# Patient Record
Sex: Male | Born: 1970 | Race: White | Hispanic: Yes | Marital: Single | Smoking: Former smoker
Health system: Southern US, Community
[De-identification: ages and names within clinical notes are randomized; demographics above are authoritative.]

## PROBLEM LIST (undated history)

## (undated) DIAGNOSIS — F419 Anxiety disorder, unspecified: Secondary | ICD-10-CM

## (undated) DIAGNOSIS — F32A Depression, unspecified: Secondary | ICD-10-CM

## (undated) DIAGNOSIS — F329 Major depressive disorder, single episode, unspecified: Secondary | ICD-10-CM

## (undated) DIAGNOSIS — F1910 Other psychoactive substance abuse, uncomplicated: Secondary | ICD-10-CM

## (undated) DIAGNOSIS — K4090 Unilateral inguinal hernia, without obstruction or gangrene, not specified as recurrent: Secondary | ICD-10-CM

## (undated) HISTORY — DX: Other psychoactive substance abuse, uncomplicated: F19.10

## (undated) HISTORY — DX: Depression, unspecified: F32.A

## (undated) HISTORY — DX: Major depressive disorder, single episode, unspecified: F32.9

---

## 1999-03-09 ENCOUNTER — Emergency Department (HOSPITAL_COMMUNITY): Admission: EM | Admit: 1999-03-09 | Discharge: 1999-03-09 | Payer: Self-pay | Admitting: Emergency Medicine

## 1999-03-09 ENCOUNTER — Encounter: Payer: Self-pay | Admitting: Emergency Medicine

## 2005-11-18 ENCOUNTER — Emergency Department (HOSPITAL_COMMUNITY): Admission: EM | Admit: 2005-11-18 | Discharge: 2005-11-18 | Payer: Self-pay | Admitting: Emergency Medicine

## 2008-07-19 ENCOUNTER — Emergency Department (HOSPITAL_COMMUNITY): Admission: EM | Admit: 2008-07-19 | Discharge: 2008-07-19 | Payer: Self-pay | Admitting: Emergency Medicine

## 2010-05-07 LAB — CBC
HCT: 44.9 % (ref 39.0–52.0)
Hemoglobin: 15.4 g/dL (ref 13.0–17.0)
MCHC: 34.4 g/dL (ref 30.0–36.0)
MCV: 88.1 fL (ref 78.0–100.0)
Platelets: 245 10*3/uL (ref 150–400)
RBC: 5.1 MIL/uL (ref 4.22–5.81)
RDW: 12.9 % (ref 11.5–15.5)
WBC: 8 10*3/uL (ref 4.0–10.5)

## 2010-05-07 LAB — BASIC METABOLIC PANEL
BUN: 23 mg/dL (ref 6–23)
CO2: 28 mEq/L (ref 19–32)
Calcium: 9.2 mg/dL (ref 8.4–10.5)
Chloride: 101 mEq/L (ref 96–112)
Creatinine, Ser: 1.22 mg/dL (ref 0.4–1.5)
GFR calc Af Amer: >60 mL/min (ref >60)
GFR calc non Af Amer: >60 mL/min (ref >60)
Glucose, Bld: 96 mg/dL (ref 70–99)
Potassium: 4 mEq/L (ref 3.5–5.1)
Sodium: 137 mEq/L (ref 135–145)

## 2010-05-07 LAB — DIFFERENTIAL
Basophils Absolute: 0 10*3/uL (ref 0.0–0.1)
Basophils Relative: 0 % (ref 0–1)
Eosinophils Absolute: 0.3 10*3/uL (ref 0.0–0.7)
Eosinophils Relative: 3 % (ref 0–5)
Lymphocytes Relative: 28 % (ref 12–46)
Lymphs Abs: 2.2 10*3/uL (ref 0.7–4.0)
Monocytes Absolute: 0.6 10*3/uL (ref 0.1–1.0)
Monocytes Relative: 7 % (ref 3–12)
Neutro Abs: 4.9 10*3/uL (ref 1.7–7.7)
Neutrophils Relative %: 62 % (ref 43–77)

## 2011-04-14 ENCOUNTER — Encounter (HOSPITAL_COMMUNITY): Payer: Self-pay

## 2011-04-14 ENCOUNTER — Emergency Department (HOSPITAL_COMMUNITY)
Admission: EM | Admit: 2011-04-14 | Discharge: 2011-04-14 | Disposition: A | Discharge status: Home / Self Care | Payer: Self-pay | Attending: Emergency Medicine | Admitting: Emergency Medicine

## 2011-04-14 DIAGNOSIS — M79609 Pain in unspecified limb: Secondary | ICD-10-CM | POA: Insufficient documentation

## 2011-04-14 DIAGNOSIS — R209 Unspecified disturbances of skin sensation: Secondary | ICD-10-CM | POA: Insufficient documentation

## 2011-04-14 DIAGNOSIS — R200 Anesthesia of skin: Secondary | ICD-10-CM

## 2011-04-14 MED ORDER — NAPROXEN 500 MG PO TABS: 500.0000 mg | ORAL_TABLET | Freq: Two times a day (BID) | ORAL | Status: AC

## 2011-04-14 NOTE — ED Provider Notes (Signed)
Medical screening examination/treatment/procedure(s) were performed by non-physician practitioner and as supervising physician I was immediately available for consultation/collaboration.  Gardiner Espana, MD 04/14/11 1521 

## 2011-04-14 NOTE — ED Provider Notes (Signed)
History     CSN: [id number]  Arrival date & time 04/14/11  4098   First MD Initiated Contact with Patient 04/14/11 0914      Chief Complaint  Patient presents with  . Hand Pain    (Consider location/radiation/quality/duration/timing/severity/associated sxs/prior treatment) HPI Comments: Patient is a weightlifter presents with complaint of right shoulder, upper arm, forearm, and hand numbness for the past 2 weeks. Patient states he is awakened at night due to numbness sensation. Patient denies weakness in his extremity. He does not have any pain. Denies acute injury. Patient states that the numbness began in his shoulder in its gradually spread distally. Currently the patient has worse numbness in his hand and forearm and his upper arm and shoulder are normal. No treatments prior to arrival. Nothing makes it better or worse.   The history is provided by the patient.    History reviewed. No pertinent past medical history.  History reviewed. No pertinent past surgical history.  No family history on file.  History  Substance Use Topics  . Smoking status: Never Smoker   . Smokeless tobacco: Not on file  . Alcohol Use: No      Review of Systems  Constitutional: Negative for activity change.  HENT: Negative for neck pain.   Musculoskeletal: Negative for back pain, joint swelling and arthralgias.  Skin: Negative for wound.  Neurological: Positive for numbness. Negative for weakness.    Allergies  Review of patient's allergies indicates not on file.  Home Medications   Current Outpatient Rx  Name Route Sig Dispense Refill  . ACETAMINOPHEN 500 MG PO TABS Oral Take 500 mg by mouth every 6 (six) hours as needed. For headache.    . ALPRAZOLAM 1 MG PO TABS Oral Take 0.5 mg by mouth 2 (two) times daily.    [name] VITAMIN C 1000 MG PO TABS Oral Take 2,000 mg by mouth daily.    Carma Leaven M PLUS PO TABS Oral Take 1 tablet by mouth daily.    . TRAZODONE HCL 100 MG PO TABS Oral Take 100 mg  by mouth at bedtime.      BP 126/65  Pulse 51  Temp(Src) 98.4 F (36.9 C) (Oral)  Resp 16  SpO2 100%  Physical Exam  Nursing note and vitals reviewed. Constitutional: He is oriented to person, place, and time. He appears well-developed and well-nourished.  HENT:  Head: Normocephalic and atraumatic.  Eyes: Conjunctivae are normal.  Neck: Normal range of motion. Neck supple.  Cardiovascular: Normal pulses.   Pulses:      Radial pulses are 2+ on the right side, and 2+ on the left side.  Musculoskeletal: He exhibits no edema and no tenderness.  Neurological: He is alert and oriented to person, place, and time. He has normal strength. He displays no atrophy and no tremor. A sensory deficit is present. No cranial nerve deficit. He exhibits normal muscle tone. GCS eye subscore is 4. GCS verbal subscore is 5. GCS motor subscore is 6.  Reflex Scores:      Bicep reflexes are 2+ on the right side and 2+ on the left side.      Brachioradialis reflexes are 2+ on the right side and 2+ on the left side.      Numbness reported in hand and forearm, not in a definite dermatomal pattern  Skin: Skin is warm and dry.  Psychiatric: He has a normal mood and affect.    ED Course  Procedures (including critical care time)  Labs Reviewed - No data to display No results found.   1. Numbness     9:55 AM Patient seen and examined.   Vital signs reviewed and are as follows: Filed Vitals:   04/14/11 0837  BP: 126/65  Pulse: 51  Temp: 98.4 F (36.9 C)  Resp: 16   10:01 AM D/c to home with anti-inflammatory medications and orthopedic followup. Patient urged to rest his shoulder and refrain from heavy lifting until cleared to do so by orthopedic doctor. Patient verbalizes understanding and agrees with plan.   MDM  Likely nerve impingement or irritation stemming from repetitive use with weight-lifting. Rest and anti-inflammatories indicated with orthopedic followup. Patient has normal strength in  bilateral upper extremities and hands, wrist, elbow, and shoulder.        Renne Crigler, [place] 04/14/11 1006

## 2011-04-14 NOTE — Discharge Instructions (Signed)
Please read and follow all provided instructions.  Your diagnoses today include:  1. Numbness     Tests performed today include:  Vital signs. See below for your results today.   Medications prescribed:   Naproxen - anti-inflammatory pain medication  Do not exceed 500mg  naproxen every 12 hours  You have been prescribed an anti-inflammatory medication or NSAID. Take with food. Take smallest effective dose for the shortest duration needed for your pain. Stop taking if you experience stomach pain or vomiting.   Take any prescribed medications only as directed.  Home care instructions:  Follow any educational materials contained in this packet.  Follow-up instructions: Please follow-up with your primary care provider in the next 3 days for further evaluation of your symptoms. If you do not have a primary care doctor -- see below for referral information.   Call the orthopedic physician provided for further evaluation.   Do not perform any repetitive movements with your upper extremity until your symptoms improve and you are seen by an orthopedic doctor.   Return instructions:   Please return to the Emergency Department if you experience worsening symptoms.   Please return if you have any other emergent concerns.  Additional Information:  Your vital signs today were: BP 126/65  Pulse 51  Temp(Src) 98.4 F (36.9 C) (Oral)  Resp 16  SpO2 100% If your blood pressure (BP) was elevated above 135/85 this visit, please have this repeated by your doctor within one month. -------------- No Primary Care Doctor Call [facility]  [phone] Other agencies that provide inexpensive medical care    [facility]  [phone]    [facility]  [phone]    [facility]  [phone]    [facility]  [phone]    [facility]  [phone]    [facility]  [phone] -------------- RESOURCE GUIDE:  Dental Problems  Patients with  Medicaid: [facility] [phone] [address]                                            [phone] Phone:  [phone]                                                   Phone:  [phone]  If unable to pay or uninsured, contact:  [facility] or [facility]. to become qualified for the adult dental clinic.  Chronic Pain Problems Contact [facility]  [phone] Patients need to be referred by their primary care doctor.  Insufficient Money for Medicine Contact [employer]:  call "211" or [facility] [phone].  Psychological Services [facility]  [phone] [facility]  [phone] [facility]   [phone] (emergency services [phone])  Substance Abuse Resources [facility]  [phone] [facility] [phone] [facility] [place] [phone] Floydene Flock [phone] Residential & Outpatient Substance Abuse Program  [phone]  Abuse/Neglect [facility] Child Abuse Hotline [phone] [facility] Child Abuse Hotline [phone] (After Hours)  Emergency Shelter [facility] [phone]  [phone]  Maternity Homes [facility] [phone] [employer] Services [phone]  [facility] Resources  [facility]     [employer]                          [facility] Dept. [address] [place]                       [address]      [address]  Blondell Reveal Phone:  [phone]                                   Phone:  [phone]                 Phone:  [phone]  [facility] Phone:  [phone]  [facility] Child Abuse Hotline 443-003-1920 [phone] (After Hours)

## 2011-04-14 NOTE — ED Notes (Signed)
Patient reports that he developed right hand numbness and feeling asleep x 2 weeks. The first week radiated up to his shoulder this week just hand. Good capillary refill and hand warm with strong pulses. Reports does a lot of weight lifting and typing.

## 2012-04-24 ENCOUNTER — Ambulatory Visit: Payer: Self-pay | Admitting: Sports Medicine

## 2012-04-24 DIAGNOSIS — Z0289 Encounter for other administrative examinations: Secondary | ICD-10-CM

## 2013-09-20 ENCOUNTER — Encounter (HOSPITAL_BASED_OUTPATIENT_CLINIC_OR_DEPARTMENT_OTHER): Payer: Self-pay | Admitting: Emergency Medicine

## 2013-09-20 ENCOUNTER — Emergency Department (HOSPITAL_BASED_OUTPATIENT_CLINIC_OR_DEPARTMENT_OTHER)
Admission: EM | Admit: 2013-09-20 | Discharge: 2013-09-20 | Disposition: A | Discharge status: Home / Self Care | Payer: Self-pay | Attending: Emergency Medicine | Admitting: Emergency Medicine

## 2013-09-20 ENCOUNTER — Emergency Department (HOSPITAL_BASED_OUTPATIENT_CLINIC_OR_DEPARTMENT_OTHER): Payer: Self-pay

## 2013-09-20 DIAGNOSIS — N289 Disorder of kidney and ureter, unspecified: Secondary | ICD-10-CM | POA: Insufficient documentation

## 2013-09-20 DIAGNOSIS — R740 Nonspecific elevation of levels of transaminase and lactic acid dehydrogenase [LDH]: Secondary | ICD-10-CM

## 2013-09-20 DIAGNOSIS — Z79899 Other long term (current) drug therapy: Secondary | ICD-10-CM | POA: Insufficient documentation

## 2013-09-20 DIAGNOSIS — R7401 Elevation of levels of liver transaminase levels: Secondary | ICD-10-CM | POA: Insufficient documentation

## 2013-09-20 DIAGNOSIS — R079 Chest pain, unspecified: Secondary | ICD-10-CM | POA: Insufficient documentation

## 2013-09-20 DIAGNOSIS — I4510 Unspecified right bundle-branch block: Secondary | ICD-10-CM | POA: Insufficient documentation

## 2013-09-20 DIAGNOSIS — R7402 Elevation of levels of lactic acid dehydrogenase (LDH): Secondary | ICD-10-CM | POA: Insufficient documentation

## 2013-09-20 DIAGNOSIS — L509 Urticaria, unspecified: Secondary | ICD-10-CM | POA: Insufficient documentation

## 2013-09-20 LAB — COMPREHENSIVE METABOLIC PANEL
ALBUMIN: 3.4 g/dL — AB (ref 3.5–5.2)
ALT: 58 U/L — ABNORMAL HIGH (ref 0–53)
AST: 73 U/L — AB (ref 0–37)
Alkaline Phosphatase: 50 U/L (ref 39–117)
Anion gap: 13 (ref 5–15)
BILIRUBIN TOTAL: 0.5 mg/dL (ref 0.3–1.2)
BUN: 35 mg/dL — AB (ref 6–23)
CALCIUM: 9.5 mg/dL (ref 8.4–10.5)
CHLORIDE: 102 meq/L (ref 96–112)
CO2: 23 mEq/L (ref 19–32)
CREATININE: 1.4 mg/dL — AB (ref 0.50–1.35)
GFR calc Af Amer: 69 mL/min — ABNORMAL LOW (ref >90)
GFR calc non Af Amer: 59 mL/min — ABNORMAL LOW (ref >90)
Glucose, Bld: 101 mg/dL — ABNORMAL HIGH (ref 70–99)
Potassium: 3.9 mEq/L (ref 3.7–5.3)
Sodium: 138 mEq/L (ref 137–147)
Total Protein: 7.4 g/dL (ref 6.0–8.3)

## 2013-09-20 LAB — CBC WITH DIFFERENTIAL/PLATELET
BASOS ABS: 0 10*3/uL (ref 0.0–0.1)
BASOS PCT: 0 % (ref 0–1)
EOS ABS: 0.2 10*3/uL (ref 0.0–0.7)
EOS PCT: 1 % (ref 0–5)
HEMATOCRIT: 47.4 % (ref 39.0–52.0)
HEMOGLOBIN: 16.2 g/dL (ref 13.0–17.0)
Lymphocytes Relative: 22 % (ref 12–46)
Lymphs Abs: 2.6 10*3/uL (ref 0.7–4.0)
MCH: 28.9 pg (ref 26.0–34.0)
MCHC: 34.2 g/dL (ref 30.0–36.0)
MCV: 84.5 fL (ref 78.0–100.0)
MONO ABS: 0.8 10*3/uL (ref 0.1–1.0)
MONOS PCT: 7 % (ref 3–12)
Neutro Abs: 8.3 10*3/uL — ABNORMAL HIGH (ref 1.7–7.7)
Neutrophils Relative %: 70 % (ref 43–77)
Platelets: 295 10*3/uL (ref 150–400)
RBC: 5.61 MIL/uL (ref 4.22–5.81)
RDW: 14 % (ref 11.5–15.5)
WBC: 11.9 10*3/uL — ABNORMAL HIGH (ref 4.0–10.5)

## 2013-09-20 LAB — TROPONIN I: Troponin I: <0.3 ng/mL (ref <0.30)

## 2013-09-20 MED ORDER — DIPHENHYDRAMINE HCL 25 MG PO CAPS
25.0000 mg | ORAL_CAPSULE | Freq: Once | ORAL | Status: AC
  Administered 2013-09-20: 25 mg via ORAL
  Filled 2013-09-20: qty 1

## 2013-09-20 NOTE — Discharge Instructions (Signed)
Take Benadryl 25 mg every 4 hours until rash has resolved. Drink plenty of fluids. Have your kidney function and your liver function recheck by your doctor within the next week.

## 2013-09-20 NOTE — ED Notes (Signed)
Pt  Reports no pain at this time

## 2013-09-20 NOTE — ED Notes (Addendum)
Generalized rash that developed PTA. He also reports chest "compression" that developed PTA and mild SHOB.  Pt reports IM steroids recently (Saturday last dose) and daily PO steroid.

## 2013-09-20 NOTE — ED Provider Notes (Signed)
CSN: [id number]     Arrival date & time 09/20/13  1833 History  This chart was scribed for Hilario Quarry, MD by Charline Bills, ED Scribe. The patient was seen in room MH09/MH09. Patient's care was started at 7:21 PM.   Chief Complaint  Patient presents with  . Rash   Patient is a 43 y.o. male presenting with rash. The history is provided by the patient. No language interpreter was used.  Rash Location:  Leg and torso Torso rash location:  Lower back Leg rash location:  L lower leg and R lower leg Quality: itchiness and redness   Severity:  Moderate Onset quality:  Sudden Chronicity:  New Relieved by:  None tried Associated symptoms: shortness of breath (mild)    HPI Comments: Russell Calhoun is a 43 y.o. male who presents to the Emergency Department complaining of generalized rash that developed PTA. Pt first noted the rash after he woke up from a nap. He reports rash to his back and calves. He describes the rash as itchy. Pt also reports associated chest tightness and mild SOB. Pt denies chest pain. He denies new lotions. Pt states that he is taking IM and PO steroids. Pt is a nonsmoker. Pt has not taken any medications for rash.   Pt is a Systems analyst.   History reviewed. No pertinent past medical history. History reviewed. No pertinent past surgical history. No family history on file. History  Substance Use Topics  . Smoking status: Never Smoker   . Smokeless tobacco: Not on file  . Alcohol Use: No    Review of Systems  Respiratory: Positive for chest tightness and shortness of breath (mild).   Cardiovascular: Negative for chest pain.  Skin: Positive for rash.  All other systems reviewed and are negative.  Allergies  Review of patient's allergies indicates not on file.  Home Medications   Prior to Admission medications   Medication Sig Start Date End Date Taking? Authorizing Provider  methylPREDNISolone sodium succinate 30 mg/kg in sodium chloride 0.9 % 50 mL  Inject 30 mg/kg into the vein once.   Yes Historical Provider, MD  acetaminophen (TYLENOL) 500 MG tablet Take 500 mg by mouth every 6 (six) hours as needed. For headache.    Historical Provider, MD  ALPRAZolam Prudy Feeler) 1 MG tablet Take 0.5 mg by mouth daily.     Historical Provider, MD  Ascorbic Acid (VITAMIN C) 1000 MG tablet Take 2,000 mg by mouth daily.    Historical Provider, MD  Multiple Vitamins-Minerals (MULTIVITAMINS THER. W/MINERALS) TABS Take 1 tablet by mouth daily.    Historical Provider, MD  traZODone (DESYREL) 100 MG tablet Take 100 mg by mouth at bedtime.    Historical Provider, MD   Triage Vitals: BP 139/88  Pulse 76  Temp(Src) 98.6 F (37 C) (Oral)  Resp 18  Ht  (1.626 m)  Wt 175 lb (79.379 kg)  BMI 30.02 kg/m2  SpO2 100% Physical Exam  Nursing note and vitals reviewed. Constitutional: He is oriented to person, place, and time. He appears well-developed and well-nourished. No distress.  HENT:  Head: Normocephalic and atraumatic.  Right Ear: External ear normal.  Left Ear: External ear normal.  Nose: Nose normal.  Mouth/Throat: Oropharynx is clear and moist.  Eyes: Conjunctivae and EOM are normal. Pupils are equal, round, and reactive to light.  Neck: Normal range of motion. Neck supple. No tracheal deviation present.  Cardiovascular: Normal rate, regular rhythm and normal heart sounds.   Pulmonary/Chest:  Effort normal and breath sounds normal. No respiratory distress.  Abdominal: Soft. Bowel sounds are normal.  Musculoskeletal: Normal range of motion.  Neurological: He is alert and oriented to person, place, and time.  Skin: Skin is warm and dry. Rash noted. Rash is urticarial.  Psychiatric: He has a normal mood and affect. His behavior is normal. Judgment and thought content normal.   ED Course  Procedures (including critical care time) DIAGNOSTIC STUDIES: Oxygen Saturation is 100% on RA, normal by my interpretation.    COORDINATION OF CARE: 7:25  PM-Discussed treatment plan which includes CXR with pt at bedside and pt agreed to plan.   Labs Review Labs Reviewed  CBC WITH DIFFERENTIAL - Abnormal; Notable for the following:    WBC 11.9 (*)    Neutro Abs 8.3 (*)    All other components within normal limits  COMPREHENSIVE METABOLIC PANEL - Abnormal; Notable for the following:    Glucose, Bld 101 (*)    BUN 35 (*)    Creatinine, Ser 1.40 (*)    Albumin 3.4 (*)    AST 73 (*)    ALT 58 (*)    GFR calc non Af Amer 59 (*)    GFR calc Af Amer 69 (*)    All other components within normal limits  TROPONIN I   Imaging Review Dg Chest 2 View  09/20/2013   CLINICAL DATA:  Woke up with a rash with chest tightness  EXAM: CHEST  2 VIEW  COMPARISON:  None.  FINDINGS: The heart size and mediastinal contours are within normal limits. Both lungs are clear. The visualized skeletal structures are unremarkable.  IMPRESSION: No active cardiopulmonary disease.   Electronically Signed   By: Esperanza Heir M.D.   On: 09/20/2013 20:25    EKG Interpretation   Date/Time:  Monday September 20 2013 19:08:53 EDT Ventricular Rate:  69 PR Interval:  162 QRS Duration: 148 QT Interval:  428 QTC Calculation: 458 R Axis:   42 Text Interpretation:  Normal sinus rhythm Right bundle branch block  Abnormal ECG T wave inversion in v1-v3 No old tracing to compare Confirmed  by Zariya Minner MD, Duwayne Heck [phone]) on 09/20/2013 7:10:04 PM      MDM   Final diagnoses:  Urticarial rash  Right bundle branch block  Elevated transaminase level  Renal insufficiency    43 year old male comes today with a rash consistent with urticaria. He does not have any other signs or symptoms of acute allergic reaction. However he voices concerns because he has been using steroids. His creatinine is 1.4 and transaminases are elevated which could be consistent with steroid use. Given Benadryl here. An EKG was done by nursing because he had some tightness in his chest. This shows a right bundle  branch block. I do not many old tracings to compare. His not having any pain consistent with cardiac pain. I have advised the patient to stop using any parenteral or oral anabolic steroids. He is advised to fluids and have his creatinine rechecked. He is to take Benadryl every 4-6 hours until rash has resolved.  I personally performed the services described in this documentation, which was scribed in my presence. The recorded information has been reviewed and is accurate.     Hilario Quarry, MD 09/20/13 [phone]

## 2014-05-19 ENCOUNTER — Emergency Department (HOSPITAL_BASED_OUTPATIENT_CLINIC_OR_DEPARTMENT_OTHER): Payer: Self-pay

## 2014-05-19 ENCOUNTER — Encounter (HOSPITAL_BASED_OUTPATIENT_CLINIC_OR_DEPARTMENT_OTHER): Payer: Self-pay | Admitting: *Deleted

## 2014-05-19 ENCOUNTER — Emergency Department (HOSPITAL_BASED_OUTPATIENT_CLINIC_OR_DEPARTMENT_OTHER)
Admission: EM | Admit: 2014-05-19 | Discharge: 2014-05-20 | Disposition: A | Discharge status: Home / Self Care | Payer: Self-pay | Attending: Emergency Medicine | Admitting: Emergency Medicine

## 2014-05-19 DIAGNOSIS — F419 Anxiety disorder, unspecified: Secondary | ICD-10-CM | POA: Insufficient documentation

## 2014-05-19 DIAGNOSIS — K529 Noninfective gastroenteritis and colitis, unspecified: Secondary | ICD-10-CM

## 2014-05-19 DIAGNOSIS — Z79899 Other long term (current) drug therapy: Secondary | ICD-10-CM | POA: Insufficient documentation

## 2014-05-19 DIAGNOSIS — R1011 Right upper quadrant pain: Secondary | ICD-10-CM

## 2014-05-19 HISTORY — DX: Anxiety disorder, unspecified: F41.9

## 2014-05-19 LAB — LIPASE, BLOOD: Lipase: 46 U/L (ref 11–59)

## 2014-05-19 LAB — COMPREHENSIVE METABOLIC PANEL
ALBUMIN: 3.6 g/dL (ref 3.5–5.2)
ALT: 48 U/L (ref 0–53)
ANION GAP: 7 (ref 5–15)
AST: 59 U/L — ABNORMAL HIGH (ref 0–37)
Alkaline Phosphatase: 53 U/L (ref 39–117)
BILIRUBIN TOTAL: 1.1 mg/dL (ref 0.3–1.2)
BUN: 18 mg/dL (ref 6–23)
CHLORIDE: 104 mmol/L (ref 96–112)
CO2: 25 mmol/L (ref 19–32)
Calcium: 8 mg/dL — ABNORMAL LOW (ref 8.4–10.5)
Creatinine, Ser: 1.21 mg/dL (ref 0.50–1.35)
GFR, EST AFRICAN AMERICAN: 81 mL/min — AB (ref >90)
GFR, EST NON AFRICAN AMERICAN: 70 mL/min — AB (ref >90)
Glucose, Bld: 102 mg/dL — ABNORMAL HIGH (ref 70–99)
POTASSIUM: 4.5 mmol/L (ref 3.5–5.1)
Sodium: 136 mmol/L (ref 135–145)
Total Protein: 6.8 g/dL (ref 6.0–8.3)

## 2014-05-19 LAB — URINALYSIS, ROUTINE W REFLEX MICROSCOPIC
Bilirubin Urine: NEGATIVE
GLUCOSE, UA: NEGATIVE mg/dL
KETONES UR: 15 mg/dL — AB
LEUKOCYTES UA: NEGATIVE
NITRITE: NEGATIVE
PROTEIN: 30 mg/dL — AB
Specific Gravity, Urine: 1.027 (ref 1.005–1.030)
UROBILINOGEN UA: 0.2 mg/dL (ref 0.0–1.0)
pH: 5.5 (ref 5.0–8.0)

## 2014-05-19 LAB — CBC WITH DIFFERENTIAL/PLATELET
BASOS ABS: 0 10*3/uL (ref 0.0–0.1)
Basophils Relative: 0 % (ref 0–1)
EOS PCT: 2 % (ref 0–5)
Eosinophils Absolute: 0.1 10*3/uL (ref 0.0–0.7)
HCT: 45.6 % (ref 39.0–52.0)
HEMOGLOBIN: 15.2 g/dL (ref 13.0–17.0)
LYMPHS ABS: 1.6 10*3/uL (ref 0.7–4.0)
LYMPHS PCT: 35 % (ref 12–46)
MCH: 28.3 pg (ref 26.0–34.0)
MCHC: 33.3 g/dL (ref 30.0–36.0)
MCV: 84.8 fL (ref 78.0–100.0)
Monocytes Absolute: 0.5 10*3/uL (ref 0.1–1.0)
Monocytes Relative: 10 % (ref 3–12)
NEUTROS ABS: 2.4 10*3/uL (ref 1.7–7.7)
NEUTROS PCT: 53 % (ref 43–77)
PLATELETS: 225 10*3/uL (ref 150–400)
RBC: 5.38 MIL/uL (ref 4.22–5.81)
RDW: 15.3 % (ref 11.5–15.5)
WBC: 4.6 10*3/uL (ref 4.0–10.5)

## 2014-05-19 LAB — URINE MICROSCOPIC-ADD ON

## 2014-05-19 LAB — I-STAT CG4 LACTIC ACID, ED: Lactic Acid, Venous: 1.49 mmol/L (ref 0.5–2.0)

## 2014-05-19 MED ORDER — IOHEXOL 300 MG/ML  SOLN
25.0000 mL | Freq: Once | INTRAMUSCULAR | Status: AC | PRN
  Administered 2014-05-19: 25 mL via ORAL

## 2014-05-19 MED ORDER — SODIUM CHLORIDE 0.9 % IV BOLUS (SEPSIS)
1000.0000 mL | Freq: Once | INTRAVENOUS | Status: AC
  Administered 2014-05-19: 1000 mL via INTRAVENOUS

## 2014-05-19 MED ORDER — IOHEXOL 300 MG/ML  SOLN
100.0000 mL | Freq: Once | INTRAMUSCULAR | Status: AC | PRN
  Administered 2014-05-19: 100 mL via INTRAVENOUS

## 2014-05-19 NOTE — ED Provider Notes (Signed)
CSN: [id number]     Arrival date & time 05/19/14  1950 History  This [id number] was scribed for Elwin MochaBlair Finnegan Gatta, MD by Gwenyth Oberatherine Macek, ED Scribe. This patient was seen in room MH10/MH10 and the patient's care was started at 10:03 PM.   Chief Complaint  Patient presents with  . Diarrhea   Patient is a 44 y.o. male presenting with diarrhea. The history is provided by the patient. No language interpreter was used.  Diarrhea Quality:  Blood-tinged and watery Severity:  Moderate Onset quality:  Gradual Duration:  4 days Timing:  Constant Progression:  Unchanged Relieved by:  Nothing Worsened by:  Nothing tried Ineffective treatments:  None tried Associated symptoms: abdominal pain, fever and vomiting   Risk factors: sick contacts   Risk factors: no recent antibiotic use and no travel to endemic areas     HPI Comments: Russell Calhoun is a 44 y.o. male who presents to the Emergency Department complaining of intermittent, moderate, blood-tinged diarrhea that started 4 days ago. He states bloating, abdominal pain, fever of 102 and 1 episode of vomiting that occurred 3 days ago as associated symptoms. His last documented fever was 4 days ago, but pt notes that he felt feverish yesterday. Pt reports withdrawal after stopping Xanax-1 mg TID. 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 His last dose of steroids was 10 days ago and was followed by a fever. Pt denies recent international travel and antibiotic treatment. His daughter was sick with vomiting, diarrhea and fever last week.  He denies erythema or swelling of his steroid injection sites as associated symptoms.    Past Medical History  Diagnosis Date  . Anxiety    History reviewed. No pertinent past surgical history. No family history on file. History  Substance Use Topics  . Smoking status: Never Smoker   . Smokeless tobacco: Not on file  . Alcohol Use: No    Review of Systems   Constitutional: Positive for fever.  Gastrointestinal: Positive for vomiting, abdominal pain, diarrhea and abdominal distention.  Skin: Negative for wound.  All other systems reviewed and are negative.   Allergies  Review of patient's allergies indicates no known allergies.  Home Medications   Prior to Admission medications   Medication Sig Start Date End Date Taking? Authorizing Provider  acetaminophen (TYLENOL) 500 MG tablet Take 500 mg by mouth every 6 (six) hours as needed. For headache.    Historical Provider, MD  ALPRAZolam Prudy Feeler(XANAX) 1 MG tablet Take 0.5 mg by mouth daily.     Historical Provider, MD  Ascorbic Acid (VITAMIN C) 1000 MG tablet Take 2,000 mg by mouth daily.    Historical Provider, MD  methylPREDNISolone sodium succinate 30 mg/kg in sodium chloride 0.9 % 50 mL Inject 30 mg/kg into the vein once.    Historical Provider, MD  Multiple Vitamins-Minerals (MULTIVITAMINS THER. W/MINERALS) TABS Take 1 tablet by mouth daily.    Historical Provider, MD  traZODone (DESYREL) 100 MG tablet Take 100 mg by mouth at bedtime.    Historical Provider, MD   BP 124/79 mmHg  Pulse 66  Temp(Src) 98.6 F (37 C) (Oral)  Resp 18  Ht 5\' 3"  (1.6 m)  Wt 162 lb (73.483 kg)  BMI 28.70 kg/m2  SpO2 100% Physical Exam  Constitutional: He is oriented to person, place, and time. He appears well-developed and well-nourished. No distress.  HENT:  Head: Normocephalic and atraumatic.  Mouth/Throat: Oropharynx is  clear and moist. No oropharyngeal exudate.  Eyes: EOM are normal. Pupils are equal, round, and reactive to light.  Neck: Normal range of motion. Neck supple.  Cardiovascular: Normal rate and regular rhythm.  Exam reveals no friction rub.   No murmur heard. Pulmonary/Chest: Effort normal and breath sounds normal. No respiratory distress. He has no wheezes. He has no rales.  Abdominal: Soft. He exhibits no distension. There is tenderness (RLQ, RUQ). There is no rebound.  Musculoskeletal:  Normal range of motion. He exhibits no edema.  Neurological: He is alert and oriented to person, place, and time. No cranial nerve deficit. He exhibits normal muscle tone. Coordination normal.  Skin: No rash noted. He is not diaphoretic.  Nursing note and vitals reviewed.   ED Course  Procedures   DIAGNOSTIC STUDIES: Oxygen Saturation is 100% on RA, normal by my interpretation.    COORDINATION OF CARE: 10:17 PM Discussed treatment plan with pt which includes CT abdomen. Pt agreed to plan.  Labs Review Labs Reviewed  URINALYSIS, ROUTINE W REFLEX MICROSCOPIC - Abnormal; Notable for the following:    Hgb urine dipstick MODERATE (*)    Ketones, ur 15 (*)    Protein, ur 30 (*)    All other components within normal limits  URINE MICROSCOPIC-ADD ON  CBC WITH DIFFERENTIAL/PLATELET  COMPREHENSIVE METABOLIC PANEL    Imaging Review Ct Abdomen Pelvis W Contrast  05/19/2014   CLINICAL DATA:  Right upper and right lower quadrant pain for 4 days. Fever, nausea, vomiting, and diarrhea.  EXAM: CT ABDOMEN AND PELVIS WITH CONTRAST  TECHNIQUE: Multidetector CT imaging of the abdomen and pelvis was performed using the standard protocol following bolus administration of intravenous contrast.  CONTRAST:  25mL OMNIPAQUE IOHEXOL 300 MG/ML SOLN, OMNIPAQUE IOHEXOL 300 MG/ML SOLN  COMPARISON:  None.  FINDINGS: Minimal atelectasis is noted in the lung bases.  The liver, gallbladder, spleen, adrenal glands, kidneys, and pancreas are unremarkable.  Evaluation is mildly limited by paucity of intra-abdominal fat. Oral contrast is present loops of proximal small bowel. Mid and distal small bowel are diffusely fluid-filled but not frankly dilated, measuring up to approximately 2.5 cm in diameter. The appendix is not definitively identified although it may be just inferior to the cecum in the right lower quadrant (series 2, image 54 and series 5, image 38). No frank inflammatory changes are identified in this region.  No free fluid or enlarged lymph nodes are identified. No acute osseous abnormality is identified.  IMPRESSION: 1. Mildly prominent, diffusely fluid-filled small bowel loops, query enteritis. 2. Appendix not well visualized, however no definite inflammatory change in the right lower quadrant is identified.   Electronically Signed   By: Sebastian Ache   On: 05/19/2014 23:30     EKG Interpretation None      MDM   Final diagnoses:  RUQ pain  Enteritis     44 year old male here with diarrhea. He also does report some fever. Had some blood in his diarrhea yesterday but none today. Last documented fever was 3-4 days ago at 102F. Patient had a sick contact, daughter had vomiting and diarrhea recently. He thinks he started that with her. Patient also has stopped taking Xanax and IM anabolic steroids. He thinks he might of gotten a bad case of steroids is getting fevers every time he is injecting. Here vitals are stable. All injection sites in the bilateral deltoids look normal. No signs of abscess or cellulitis. Belly with mild right lower quadrant right upper quadrant  tenderness. Labs are all normal. CT shows enteritis but no signs of appendicitis. With recent blood in his stool, do not feel he needs antibiotics at this time which might exacerbate his symptoms. Started to follow PCP 3-4 days if symptoms do not improve. Stable for discharge.  I personally performed the services described in this documentation, which was scribed in my presence. The recorded information has been reviewed and is accurate.     Elwin Mocha, MD 05/20/14 [phone]

## 2014-05-19 NOTE — Discharge Instructions (Signed)

## 2014-05-19 NOTE — ED Notes (Addendum)
Fever since Saturday. Diarrhea since Sunday. Vomiting on Monday. States he has not been able to get a Rx for Xanax from his MD for a week. He has been buying street Xanax. Last dose was Saturday. States he feels he is withdrawing.

## 2014-05-19 NOTE — ED Notes (Signed)
Presents to ED w/ c/o fever since Saturday, Sunday began having diarrhea, has had one episode of vomiting with some nausea on this past Monday, also states has not been able to see primary MD for 2 months, states had to stop taking Xanax 1mg , TID, suddenly. Also states has been taking steroids, suddenly stopped this as well.

## 2014-05-19 NOTE — ED Notes (Signed)
Pt placed on cardiac monitor, VS obtained/ repeated after nsg assessment

## 2014-05-23 ENCOUNTER — Encounter: Payer: Self-pay | Admitting: Medical

## 2014-05-23 ENCOUNTER — Ambulatory Visit (INDEPENDENT_AMBULATORY_CARE_PROVIDER_SITE_OTHER): Payer: Self-pay | Admitting: Medical

## 2014-05-23 VITALS — BP 130/80 | HR 56 | Temp 97.9°F | Ht 63.5 in | Wt 169.8 lb

## 2014-05-23 DIAGNOSIS — N529 Male erectile dysfunction, unspecified: Secondary | ICD-10-CM | POA: Insufficient documentation

## 2014-05-23 DIAGNOSIS — G4700 Insomnia, unspecified: Secondary | ICD-10-CM

## 2014-05-23 DIAGNOSIS — F411 Generalized anxiety disorder: Secondary | ICD-10-CM | POA: Insufficient documentation

## 2014-05-23 DIAGNOSIS — F329 Major depressive disorder, single episode, unspecified: Secondary | ICD-10-CM

## 2014-05-23 DIAGNOSIS — F32A Depression, unspecified: Secondary | ICD-10-CM | POA: Insufficient documentation

## 2014-05-23 DIAGNOSIS — N528 Other male erectile dysfunction: Secondary | ICD-10-CM

## 2014-05-23 DIAGNOSIS — K529 Noninfective gastroenteritis and colitis, unspecified: Secondary | ICD-10-CM

## 2014-05-23 MED ORDER — SILDENAFIL CITRATE 100 MG PO TABS: 100.0000 mg | ORAL_TABLET | Freq: Every day | ORAL | Status: DC | PRN

## 2014-05-23 MED ORDER — VENLAFAXINE HCL ER 75 MG PO CP24: 75.0000 mg | ORAL_CAPSULE | Freq: Every day | ORAL | Status: DC

## 2014-05-23 MED ORDER — TRAZODONE HCL 100 MG PO TABS: 100.0000 mg | ORAL_TABLET | Freq: Every evening | ORAL | Status: DC | PRN

## 2014-05-23 MED ORDER — BUSPIRONE HCL 7.5 MG PO TABS: ORAL_TABLET | ORAL | Status: DC

## 2014-05-23 NOTE — Assessment & Plan Note (Signed)
Will rx effexor. This may also help with your anxiety. Start this in about 4-5 days after buspirone started.

## 2014-05-23 NOTE — Assessment & Plan Note (Signed)
Pt has been on benzo for years but recently got off cold [place]. Anxiety is coming back. I will rx buspirone. Since chronic condition. This likley better than being on high dose benzo.  Do not mix buspirone with any benzo.  Follow up in one month or as needed.

## 2014-05-23 NOTE — Progress Notes (Signed)
Pre visit review using our clinic review tool, if applicable. No additional management support is needed unless otherwise documented below in the visit note. 

## 2014-05-23 NOTE — Assessment & Plan Note (Signed)
Hx of and uses trazadone in past. Uses 100 mg tabs.

## 2014-05-23 NOTE — Assessment & Plan Note (Signed)
Rx viagra. 

## 2014-05-23 NOTE — Progress Notes (Signed)
Subjective:    Patient ID: Russell Calhoun, male    DOB: 04-Oct-1970, 44 y.o.   MRN: [id number]  HPI   I have reviewed pt PMH, PSH, FH, Social History and Surgical History.  Anxiety- pt has some anxiety throughout his life. He states with work and responsibility. He was trazadone and some anxiety in th past. Pt has been on xanax for a long time. He cut back completely. He was formerly on xanax 1 mg tid for years. He is on clonazapam which he borrowed from his girlfriend but on for 3 days and then ran out.  Pt has history of depression. Pt mom commited suicide(Pt witnessed the suicide). Pt mom was bipolar. Pt never been on effexor. Pt has tried paxil, sertraline, and celexa. He not sure if those worked. He feels like this helped most.   Pt also has some erectile dysfunction. He related this to steroid use. 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 Pt not used steroids now for 8 weeks.  Pt has some insomnia. He can't sleep unless he uses trazadone.  Substance abuse- in the past used Street drugs. But none since making come back.   Lt shoulder surgery. Also left knee surgery.(Both were related to spurrs)  Pt in ED recently. This past Sunday had fever, chills and diarrhea. He had some labs and IV. Determined viral gastroenteritis.   By Saturday am he was better. His diarrhea has improved.     Review of Systems  Constitutional: Negative for fever, chills, diaphoresis, activity change and fatigue.  Respiratory: Negative for cough, chest tightness and shortness of breath.   Cardiovascular: Negative for chest pain, palpitations and leg swelling.  Gastrointestinal: Negative for nausea, vomiting and abdominal pain.       Resolved.  Genitourinary:       Erectile dysfunction.  Musculoskeletal: Negative for neck pain and neck stiffness.  Neurological: Negative for dizziness, tremors, seizures, syncope, facial asymmetry, speech difficulty, weakness, light-headedness, numbness and  headaches.  Psychiatric/Behavioral: Positive for dysphoric mood. Negative for behavioral problems, confusion and agitation. The patient is nervous/anxious.        But controlled presently.  Insmonia with his depression and anxiety.    Past Medical History  Diagnosis Date  . Anxiety   . Depression   . Substance abuse     History   Social History  . Marital Status: Single    Spouse Name: N/A  . Number of Children: N/A  . Years of Education: N/A   Occupational History  . Not on file.   Social History Main Topics  . Smoking status: Never Smoker   . Smokeless tobacco: Not on file  . Alcohol Use: No  . Drug Use: Not on file  . Sexual Activity: Not on file   Other Topics Concern  . Not on file   Social History Narrative    No past surgical history on file.  Family History  Problem Relation Age of Onset  . Mental illness Mother   . HIV/AIDS Father     No Known Allergies  Current Outpatient Prescriptions on File Prior to Visit  Medication Sig Dispense Refill  . acetaminophen (TYLENOL) 500 MG tablet Take 500 mg by mouth every 6 (six) hours as needed. For headache.    . Ascorbic Acid (VITAMIN C) 1000 MG tablet Take 2,000 mg by mouth daily.    . Multiple Vitamins-Minerals (MULTIVITAMINS THER. W/MINERALS) TABS Take 1 tablet by mouth daily.     No current facility-administered  medications on file prior to visit.    BP 130/80 mmHg  Pulse 56  Temp(Src) 97.9 F (36.6 C) (Oral)  Ht 5' 3.5" (1.613 m)  Wt 169 lb 12.8 oz (77.021 kg)  BMI 29.60 kg/m2  SpO2 96%       Objective:   Physical Exam  General Mental Status- Alert. General Appearance- Not in acute distress.   Eyes- Scleraeral/Conjuntiva-bilat- Not Yellow  Skin General: Color- Normal Color. Moisture- Normal Moisture.  Neck Carotid Arteries- Normal color. Moisture- Normal Moisture. No carotid bruits. No JVD.  Chest and Lung Exam Auscultation: Breath  Sounds:-Normal.  Cardiovascular Auscultation:Rythm- Regular. Murmurs & Other Heart Sounds:Auscultation of the heart reveals- No Murmurs.    Neurologic Cranial Nerve exam:- CN III-XII intact(No nystagmus), symmetric smile. Strength:- 5/5 equal and symmetric strength both upper and lower extremities.    Eyes- Scleraeral/Conjuntiva-bilat- Not Yellow.   Abdomen Inspection:-Inspection Normal.  Palpation/Perucssion: Palpation and Percussion of the abdomen reveal- Non Tender, No Rebound tenderness, No rigidity(Guarding) and No Palpable abdominal masses.  Liver:-Normal.  Spleen:- Normal.        Assessment & Plan:

## 2014-05-23 NOTE — Patient Instructions (Addendum)
Generalized anxiety disorder Pt has been on benzo for years but recently got off cold [place]. Anxiety is coming back. I will rx buspirone. Since chronic condition. This likley better than being on high dose benzo.  Do not mix buspirone with any benzo.  Follow up in one month or as needed.   Depression Will rx effexor. This may also help with your anxiety. Start this in about 4-5 days after buspirone started.   Insomnia Hx of and uses trazadone in past. Uses 100 mg tabs.   Gastroenteritis Resolved now.   Erectile dysfunction Rx viagra.     Follow up in one month or as needed  Pt is aware of dangers of anabolic steroids. When in for follow up will counsel him further on dangers.

## 2014-05-23 NOTE — Assessment & Plan Note (Signed)
Resolved now. 

## 2014-05-27 ENCOUNTER — Telehealth: Payer: Self-pay | Admitting: Medical

## 2014-05-27 NOTE — Telephone Encounter (Signed)
Caller name:Rhude, Kraven Relation to ZO:XWRUpt:self Call back number:[phone] Pharmacy:[facility]  Reason for call:  Pt was seen on 05/23/14 states Ramon Dredgedward had informed him to let him know if the buspirone was not working, pt states that the rx is not working for him at all and he would like to try clonazepan if possible. States he has tried it and it works for him.

## 2014-05-30 ENCOUNTER — Telehealth: Payer: Self-pay | Admitting: Medical

## 2014-05-30 NOTE — Telephone Encounter (Signed)
Left a message for pt to call back. He was not available. Was going to discuss rx of clonazapem.

## 2014-05-30 NOTE — Telephone Encounter (Signed)
Ask pt to call back and speak with lpn. Then I can speak with him directly.

## 2014-06-02 ENCOUNTER — Telehealth: Payer: Self-pay | Admitting: Medical

## 2014-06-02 NOTE — Telephone Encounter (Signed)
I talked with pt regarding policy of controlled meds and eventual cost of drug screen which is quite high(he has no  Insurance). He has appointment with other provider today. He understands why did not rx benzo. Unforunately buspirone did not help.

## 2014-06-29 ENCOUNTER — Ambulatory Visit: Payer: Self-pay | Admitting: Medical

## 2014-06-29 DIAGNOSIS — Z0289 Encounter for other administrative examinations: Secondary | ICD-10-CM

## 2014-07-05 ENCOUNTER — Encounter: Payer: Self-pay | Admitting: Medical

## 2014-07-05 ENCOUNTER — Telehealth: Payer: Self-pay | Admitting: Medical

## 2014-07-05 NOTE — Telephone Encounter (Signed)
Pt was no show for appt on 06/29/14- letter sent. Charge pt?

## 2014-07-05 NOTE — Telephone Encounter (Signed)
No charge. 

## 2015-04-25 ENCOUNTER — Ambulatory Visit: Payer: Self-pay | Admitting: Family Medicine

## 2016-02-19 ENCOUNTER — Encounter (HOSPITAL_BASED_OUTPATIENT_CLINIC_OR_DEPARTMENT_OTHER): Payer: Self-pay | Admitting: *Deleted

## 2016-02-19 ENCOUNTER — Emergency Department (HOSPITAL_BASED_OUTPATIENT_CLINIC_OR_DEPARTMENT_OTHER): Payer: Self-pay

## 2016-02-19 ENCOUNTER — Emergency Department (HOSPITAL_BASED_OUTPATIENT_CLINIC_OR_DEPARTMENT_OTHER)
Admission: EM | Admit: 2016-02-19 | Discharge: 2016-02-20 | Disposition: A | Discharge status: Other Acute Inpatient Hospital | Payer: Self-pay | Attending: Emergency Medicine | Admitting: Emergency Medicine

## 2016-02-19 DIAGNOSIS — R45851 Suicidal ideations: Secondary | ICD-10-CM | POA: Insufficient documentation

## 2016-02-19 DIAGNOSIS — T50902A Poisoning by unspecified drugs, medicaments and biological substances, intentional self-harm, initial encounter: Secondary | ICD-10-CM

## 2016-02-19 DIAGNOSIS — Y9389 Activity, other specified: Secondary | ICD-10-CM | POA: Insufficient documentation

## 2016-02-19 DIAGNOSIS — S0990XA Unspecified injury of head, initial encounter: Secondary | ICD-10-CM | POA: Insufficient documentation

## 2016-02-19 DIAGNOSIS — Y998 Other external cause status: Secondary | ICD-10-CM | POA: Insufficient documentation

## 2016-02-19 DIAGNOSIS — T424X2A Poisoning by benzodiazepines, intentional self-harm, initial encounter: Secondary | ICD-10-CM | POA: Insufficient documentation

## 2016-02-19 DIAGNOSIS — Z79899 Other long term (current) drug therapy: Secondary | ICD-10-CM | POA: Insufficient documentation

## 2016-02-19 DIAGNOSIS — T1491XA Suicide attempt, initial encounter: Secondary | ICD-10-CM

## 2016-02-19 DIAGNOSIS — Y9259 Other trade areas as the place of occurrence of the external cause: Secondary | ICD-10-CM | POA: Insufficient documentation

## 2016-02-19 DIAGNOSIS — W1839XA Other fall on same level, initial encounter: Secondary | ICD-10-CM | POA: Insufficient documentation

## 2016-02-19 DIAGNOSIS — F411 Generalized anxiety disorder: Secondary | ICD-10-CM

## 2016-02-19 DIAGNOSIS — S0081XA Abrasion of other part of head, initial encounter: Secondary | ICD-10-CM | POA: Insufficient documentation

## 2016-02-19 DIAGNOSIS — F332 Major depressive disorder, recurrent severe without psychotic features: Secondary | ICD-10-CM | POA: Diagnosis present

## 2016-02-19 LAB — CBC
HCT: 42.6 % (ref 39.0–52.0)
Hemoglobin: 14.6 g/dL (ref 13.0–17.0)
MCH: 29.7 pg (ref 26.0–34.0)
MCHC: 34.3 g/dL (ref 30.0–36.0)
MCV: 86.6 fL (ref 78.0–100.0)
PLATELETS: 237 10*3/uL (ref 150–400)
RBC: 4.92 MIL/uL (ref 4.22–5.81)
RDW: 12.5 % (ref 11.5–15.5)
WBC: 10.1 10*3/uL (ref 4.0–10.5)

## 2016-02-19 LAB — COMPREHENSIVE METABOLIC PANEL
ALK PHOS: 63 U/L (ref 38–126)
ALT: 15 U/L — AB (ref 17–63)
ANION GAP: 7 (ref 5–15)
AST: 22 U/L (ref 15–41)
Albumin: 4.2 g/dL (ref 3.5–5.0)
BUN: 13 mg/dL (ref 6–20)
CALCIUM: 8.9 mg/dL (ref 8.9–10.3)
CHLORIDE: 98 mmol/L — AB (ref 101–111)
CO2: 29 mmol/L (ref 22–32)
CREATININE: 1.19 mg/dL (ref 0.61–1.24)
Glucose, Bld: 121 mg/dL — ABNORMAL HIGH (ref 65–99)
Potassium: 3.5 mmol/L (ref 3.5–5.1)
SODIUM: 134 mmol/L — AB (ref 135–145)
Total Bilirubin: 0.8 mg/dL (ref 0.3–1.2)
Total Protein: 7.2 g/dL (ref 6.5–8.1)

## 2016-02-19 LAB — SALICYLATE LEVEL: Salicylate Lvl: <7 mg/dL (ref 2.8–30.0)

## 2016-02-19 LAB — ACETAMINOPHEN LEVEL: Acetaminophen (Tylenol), Serum: <10 ug/mL — ABNORMAL LOW (ref 10–30)

## 2016-02-19 LAB — ETHANOL

## 2016-02-19 NOTE — ED Notes (Addendum)
Pt states he was prescribed remeron, lexapro, and a seizure medication 3 weeks ago for depression and states his thoughts have become more severe since then. Pt has also had a recent career change and new pregnancy with his girl friend that also has caused an increase in stress. Pt states the xanax he took today was not his prescription, he bought it on the street.

## 2016-02-19 NOTE — ED Notes (Signed)
Pt girl friend states that he did not answer his cell all day. He checked himself into a hotel where he took the xanax and was planning to jump off the balcony.

## 2016-02-19 NOTE — ED Notes (Signed)
Security at bedside to wand patient at this time.

## 2016-02-19 NOTE — ED Triage Notes (Signed)
Pt states he took xanax 2 mg 8 tabs  At 1730, pt states he is suicidal with plan to jump off [place].

## 2016-02-19 NOTE — ED Provider Notes (Signed)
[facility] Provider Note   CSN: [id number] Arrival date & time: 02/19/16  2201  By signing my name below, I, Linna Darner, attest that this documentation has been prepared under the direction and in the presence of [employer], PA-C. Electronically Signed: Linna Darner, Scribe. 02/19/2016. 10:47 PM.  History   Chief Complaint Chief Complaint  Patient presents with  . Drug Overdose    The history is provided by the patient. No language interpreter was used.     HPI Comments: Russell Calhoun is a 46 y.o. male with PMHx significant for anxiety, depression, SI, and suicide attempt who presents to the Emergency Department complaining of a drug overdose occurring earlier today and suicidal ideation for about 6 months. He states he took eight tablets of Xanax 2 mg and lost consciousness. Pt reports he was trying to harm himself; he states he planned on taking the xanax and jumping off the balcony of the hotel room that he had reserved. He states he acquired the Xanax off the street. He reports an abrasion to his forehead that he believes was sustained when he hit the floor after losing consciousness. He uses Lexapro and Remeron for anxiety and depression and denies abusing these medications. Pt endorses smoking marijuana occasionally but denies any other illicit drug use. No alcohol or tobacco use. He notes a h/o suicide attempt several years ago and was admitted to the hospital for a couple of days. He reports he has spoken to counselors for his suicidal thoughts in the past. Pt denies HI, hallucinations, CP, SOB, abdominal pain, nausea, vomiting, or any other associated symptoms.  Past Medical History:  Diagnosis Date  . Anxiety   . Depression   . Substance abuse     Patient Active Problem List   Diagnosis Date Noted  . Generalized anxiety disorder 05/23/2014  . Depression 05/23/2014  . Erectile dysfunction 05/23/2014  . Insomnia 05/23/2014  . Gastroenteritis 05/23/2014     History reviewed. No pertinent surgical history.     Home Medications    Prior to Admission medications   Medication Sig Start Date End Date Taking? Authorizing Provider  acetaminophen (TYLENOL) 500 MG tablet Take 500 mg by mouth every 6 (six) hours as needed. For headache.    Historical Provider, MD  Ascorbic Acid (VITAMIN C) 1000 MG tablet Take 2,000 mg by mouth daily.    Historical Provider, MD  busPIRone (BUSPAR) 7.5 MG tablet 1 tab po bid 05/23/14   Esperanza Richters, PA-C  Multiple Vitamins-Minerals (MULTIVITAMINS THER. W/MINERALS) TABS Take 1 tablet by mouth daily.    Historical Provider, MD  sildenafil (VIAGRA) 100 MG tablet Take 1 tablet (100 mg total) by mouth daily as needed for erectile dysfunction. 05/23/14   Ramon Dredge Saguier, PA-C  traZODone (DESYREL) 100 MG tablet Take 1 tablet (100 mg total) by mouth at bedtime as needed for sleep. 05/23/14   Ramon Dredge Saguier, PA-C  venlafaxine XR (EFFEXOR XR) 75 MG 24 hr capsule Take 1 capsule (75 mg total) by mouth daily with breakfast. 05/23/14   Esperanza Richters, PA-C    Family History Family History  Problem Relation Age of Onset  . Mental illness Mother   . HIV/AIDS Father     Social History Social History  Substance Use Topics  . Smoking status: Never Smoker  . Smokeless tobacco: Not on file  . Alcohol use Yes     Allergies   Patient has no known allergies.   Review of Systems Review of Systems  Respiratory: Negative for shortness of breath.   Cardiovascular: Negative for chest pain.  Gastrointestinal: Negative for abdominal pain, nausea and vomiting.  Skin: Positive for wound.  Psychiatric/Behavioral: Positive for dysphoric mood and suicidal ideas. Negative for hallucinations.       Negative for homicidal ideas.     Physical Exam Updated Vital Signs BP 112/66 (BP Location: Left Arm)   Pulse (!) 50   Temp 98.2 F (36.8 C) (Oral)   Resp 20   Ht 5\' 3"  (1.6 m)   Wt 72.6 kg   SpO2 98%   BMI 28.34 kg/m    Physical Exam  Constitutional: He appears well-developed and well-nourished. No distress.  HENT:  Head: Normocephalic.  Mouth/Throat: Oropharynx is clear and moist. No oropharyngeal exudate.  Minor abrasion to right frontal area and minor abrasion with underlying edema above right eyebrow.  Eyes: Conjunctivae and EOM are normal. Pupils are equal, round, and reactive to light. Right eye exhibits no discharge. Left eye exhibits no discharge. No scleral icterus.  Neck: Normal range of motion. Neck supple. No spinous process tenderness present. No thyromegaly present.  Cardiovascular: Normal rate, regular rhythm, normal heart sounds and intact distal pulses.  Exam reveals no gallop and no friction rub.   No murmur heard. Pulmonary/Chest: Effort normal and breath sounds normal. No stridor. No respiratory distress. He has no wheezes. He has no rales.  Abdominal: Soft. Bowel sounds are normal. He exhibits no distension. There is no tenderness. There is no rebound and no guarding.  Musculoskeletal: He exhibits no edema.  Lymphadenopathy:    He has no cervical adenopathy.  Neurological: He is alert. Coordination normal.  CN 3-12 intact; normal sensation throughout; 5/5 strength in all 4 extremities; equal bilateral grip strength. No ataxia on finger to nose.  Skin: Skin is warm and dry. No rash noted. He is not diaphoretic. No pallor.  Psychiatric: His speech is normal and behavior is normal. He is not actively hallucinating. He exhibits a depressed mood. He expresses suicidal ideation. He expresses no homicidal ideation. He expresses suicidal plans. He expresses no homicidal plans.  Nursing note and vitals reviewed.    ED Treatments / Results  Labs (all labs ordered are listed, but only abnormal results are displayed) Labs Reviewed  COMPREHENSIVE METABOLIC PANEL - Abnormal; Notable for the following:       Result Value   Sodium 134 (*)    Chloride 98 (*)    Glucose, Bld 121 (*)    ALT 15  (*)    All other components within normal limits  ACETAMINOPHEN LEVEL - Abnormal; Notable for the following:    Acetaminophen (Tylenol), Serum <10 (*)    All other components within normal limits  ETHANOL  CBC  SALICYLATE LEVEL  RAPID URINE DRUG SCREEN, HOSP PERFORMED    EKG  EKG Interpretation  Date/Time:  Monday February 19 2016 23:13:21 EST Ventricular Rate:  57 PR Interval:    QRS Duration: 156 QT Interval:  463 QTC Calculation: 451 R Axis:   50 Text Interpretation:  Sinus rhythm Right bundle branch block Baseline wander in lead(s) V2 V3 unchanged from Sep 20, 2013 Confirmed by [facility]  MD, [facility] ([id number]) on 02/19/2016 11:18:06 PM       Radiology Ct Head Wo Contrast  Result Date: 02/19/2016 CLINICAL DATA:  Status post head injury after drug overdose, with small right frontal abrasion. Loss of consciousness. Initial encounter. EXAM: CT HEAD WITHOUT CONTRAST TECHNIQUE: Contiguous axial images were obtained from the base  of the skull through the vertex without intravenous contrast. COMPARISON:  None. FINDINGS: Brain: No evidence of acute infarction, hemorrhage, hydrocephalus, extra-axial collection or mass lesion/mass effect. The posterior fossa, including the cerebellum, brainstem and fourth ventricle, is within normal limits. The third and lateral ventricles, and basal ganglia are unremarkable in appearance. The cerebral hemispheres are symmetric in appearance, with normal gray-white differentiation. No mass effect or midline shift is seen. Vascular: No hyperdense vessel or unexpected calcification. Skull: There is no evidence of fracture; visualized osseous structures are unremarkable in appearance. Sinuses/Orbits: The orbits are within normal limits. The paranasal sinuses and mastoid air cells are well-aerated. Other: No significant soft tissue abnormalities are seen. IMPRESSION: No evidence of traumatic intracranial injury or fracture. Electronically Signed   By: Roanna RaiderJeffery  Chang  M.D.   On: 02/19/2016 23:52    Procedures Procedures (including critical care time)  DIAGNOSTIC STUDIES: Oxygen Saturation is 98% on RA, normal by my interpretation.    COORDINATION OF CARE: 11:00 PM Discussed treatment plan with pt at bedside and pt agreed to plan.  Medications Ordered in ED Medications - No data to display   Initial Impression / Assessment and Plan / ED Course  I have reviewed the triage vital signs and the nursing notes.  Pertinent labs & imaging results that were available during my care of the patient were reviewed by me and considered in my medical decision making (see chart for details).     Patient presenting following a suicide attempt with drug overdose. Patient is medically cleared. CBC unremarkable. CMP shows sodium 134, chloride 98, glucose 121, ALT 15. Salicylate, acetaminophen, ethanol. CT head negative. EKG shows no change since last tracing, NSR, right bundle branch block. TTS consult pending. [facility]. I spoke with [facility] nurse and Dr. Mora Bellmanni who are expecting the patient. TTS pending.  Final Clinical Impressions(s) / ED Diagnoses   Final diagnoses:  Intentional drug overdose, initial encounter [facility](HCC)  Suicide attempt    New Prescriptions New Prescriptions   No medications on file   I personally performed the services described in this documentation, which was scribed in my presence. The recorded information has been reviewed and is accurate.    Emi Holeslexandra M Arnell Mausolf, PA-C 02/20/16 [id number]    Gwyneth SproutWhitney Plunkett, MD 02/22/16 2217

## 2016-02-19 NOTE — ED Notes (Signed)
Patient placed into burgundy scrubs and hospital socks per protocol. Advised patients girlfriend to take belongings listed prior to this note home with her. Belongings were placed at nurses station until her departure. Girlfriend name is Kathee PoliteLindsey Idol and can be reached at [phone]. Patient placed on telemetry/spo2 monitor and given warm blankets. Patient is resting comfortably at this time with girlfriend at bedside.

## 2016-02-20 ENCOUNTER — Encounter (HOSPITAL_COMMUNITY): Payer: Self-pay | Admitting: *Deleted

## 2016-02-20 ENCOUNTER — Inpatient Hospital Stay (HOSPITAL_COMMUNITY)
Admission: AD | Admit: 2016-02-20 | Discharge: 2016-02-26 | DRG: 885 | Disposition: A | Discharge status: Home / Self Care | Payer: Federal, State, Local not specified - Other | Source: Intra-hospital | Attending: Psychiatry | Admitting: Psychiatry

## 2016-02-20 ENCOUNTER — Telehealth: Payer: Self-pay | Admitting: Medical

## 2016-02-20 DIAGNOSIS — F1320 Sedative, hypnotic or anxiolytic dependence, uncomplicated: Secondary | ICD-10-CM | POA: Diagnosis present

## 2016-02-20 DIAGNOSIS — F332 Major depressive disorder, recurrent severe without psychotic features: Secondary | ICD-10-CM | POA: Diagnosis present

## 2016-02-20 DIAGNOSIS — F4010 Social phobia, unspecified: Secondary | ICD-10-CM | POA: Diagnosis present

## 2016-02-20 DIAGNOSIS — F411 Generalized anxiety disorder: Secondary | ICD-10-CM | POA: Diagnosis present

## 2016-02-20 DIAGNOSIS — Z830 Family history of human immunodeficiency virus [HIV] disease: Secondary | ICD-10-CM | POA: Diagnosis not present

## 2016-02-20 DIAGNOSIS — R45851 Suicidal ideations: Secondary | ICD-10-CM | POA: Diagnosis present

## 2016-02-20 DIAGNOSIS — Z818 Family history of other mental and behavioral disorders: Secondary | ICD-10-CM | POA: Diagnosis not present

## 2016-02-20 DIAGNOSIS — Z79899 Other long term (current) drug therapy: Secondary | ICD-10-CM | POA: Diagnosis not present

## 2016-02-20 DIAGNOSIS — F064 Anxiety disorder due to known physiological condition: Secondary | ICD-10-CM | POA: Diagnosis present

## 2016-02-20 DIAGNOSIS — G4700 Insomnia, unspecified: Secondary | ICD-10-CM | POA: Diagnosis present

## 2016-02-20 LAB — RAPID URINE DRUG SCREEN, HOSP PERFORMED
Amphetamines: NOT DETECTED
BARBITURATES: NOT DETECTED
Benzodiazepines: POSITIVE — AB
COCAINE: NOT DETECTED
Opiates: NOT DETECTED
Tetrahydrocannabinol: POSITIVE — AB

## 2016-02-20 MED ORDER — DIVALPROEX SODIUM 250 MG PO DR TAB
250.0000 mg | DELAYED_RELEASE_TABLET | Freq: Two times a day (BID) | ORAL | Status: DC
  Administered 2016-02-20 – 2016-02-21 (×2): 250 mg via ORAL
  Filled 2016-02-20 (×4): qty 1

## 2016-02-20 MED ORDER — ESCITALOPRAM OXALATE 20 MG PO TABS
20.0000 mg | ORAL_TABLET | Freq: Every day | ORAL | Status: DC
  Administered 2016-02-21: 20 mg via ORAL
  Filled 2016-02-20: qty 2
  Filled 2016-02-20: qty 1

## 2016-02-20 MED ORDER — DIVALPROEX SODIUM 250 MG PO DR TAB
250.0000 mg | DELAYED_RELEASE_TABLET | Freq: Two times a day (BID) | ORAL | Status: DC
  Administered 2016-02-20: 250 mg via ORAL
  Filled 2016-02-20: qty 1

## 2016-02-20 MED ORDER — IBUPROFEN 200 MG PO TABS: 600.0000 mg | ORAL_TABLET | Freq: Three times a day (TID) | ORAL | Status: DC | PRN

## 2016-02-20 MED ORDER — VENLAFAXINE HCL ER 75 MG PO CP24
75.0000 mg | ORAL_CAPSULE | Freq: Every day | ORAL | Status: DC
Start: 2016-02-20 — End: 2016-02-20
  Administered 2016-02-20: 75 mg via ORAL
  Filled 2016-02-20: qty 1

## 2016-02-20 MED ORDER — IBUPROFEN 600 MG PO TABS: 600.0000 mg | ORAL_TABLET | Freq: Three times a day (TID) | ORAL | Status: DC | PRN

## 2016-02-20 MED ORDER — ALUM & MAG HYDROXIDE-SIMETH 200-200-20 MG/5ML PO SUSP: 30.0000 mL | ORAL | Status: DC | PRN

## 2016-02-20 MED ORDER — ONDANSETRON HCL 4 MG PO TABS
4.0000 mg | ORAL_TABLET | Freq: Three times a day (TID) | ORAL | Status: DC | PRN
Start: 2016-02-20 — End: 2016-02-21

## 2016-02-20 MED ORDER — VENLAFAXINE HCL ER 75 MG PO CP24
75.0000 mg | ORAL_CAPSULE | Freq: Every day | ORAL | Status: DC
  Administered 2016-02-21: 75 mg via ORAL
  Filled 2016-02-20 (×2): qty 1

## 2016-02-20 MED ORDER — MIRTAZAPINE 30 MG PO TABS
30.0000 mg | ORAL_TABLET | Freq: Every day | ORAL | Status: DC
  Filled 2016-02-20: qty 1

## 2016-02-20 MED ORDER — ESCITALOPRAM OXALATE 10 MG PO TABS
20.0000 mg | ORAL_TABLET | Freq: Every day | ORAL | Status: DC
  Administered 2016-02-20: 20 mg via ORAL
  Filled 2016-02-20: qty 2

## 2016-02-20 MED ORDER — ACETAMINOPHEN 325 MG PO TABS: 650.0000 mg | ORAL_TABLET | ORAL | Status: DC | PRN

## 2016-02-20 MED ORDER — ONDANSETRON HCL 4 MG PO TABS: 4.0000 mg | ORAL_TABLET | Freq: Three times a day (TID) | ORAL | Status: DC | PRN

## 2016-02-20 MED ORDER — MIRTAZAPINE 30 MG PO TABS: 30.0000 mg | ORAL_TABLET | Freq: Every day | ORAL | Status: DC

## 2016-02-20 MED ORDER — MAGNESIUM HYDROXIDE 400 MG/5ML PO SUSP: 30.0000 mL | Freq: Every day | ORAL | Status: DC | PRN

## 2016-02-20 MED ORDER — TRAZODONE HCL 100 MG PO TABS
100.0000 mg | ORAL_TABLET | Freq: Every evening | ORAL | Status: DC | PRN
  Administered 2016-02-20: 100 mg via ORAL
  Filled 2016-02-20: qty 1

## 2016-02-20 NOTE — Telephone Encounter (Signed)
I have not seen pt in one year and a half. Pt has visit in [facility] from [facility] that he saw [facility]. So it appears he has new pcp? Pt was seen in ED for suicide attempt(overdose). Hx of anxiety. ED sent me note and he might be told to follow up with me. I am not opposed if he needs to be seen. But if he chose other provider then it is more appropiate to follow up with them. Depends on what pt prefers. Will you put a note in chart so RN's/whoever coordinates follow up is aware.

## 2016-02-20 NOTE — Progress Notes (Signed)
Per Karleen HampshireSpencer, [place] inpatient criteria for psychiatric care. Idamae Coccia K. Sherlon HandingHarris, LCAS-A, LPC-A, [facility]  Counselor 02/20/2016 1:42 AM

## 2016-02-20 NOTE — ED Notes (Signed)
[facility] states pt has been accepted for inpatient but no beds available at this time

## 2016-02-20 NOTE — Tx Team (Signed)
Initial Treatment Plan 02/20/2016 6:27 PM Russell Calhoun [id number]    PATIENT STRESSORS: Financial difficulties Occupational concerns   PATIENT STRENGTHS: Ability for insight Barrister's clerkCommunication skills Motivation for treatment/growth Supportive family/friends   PATIENT IDENTIFIED PROBLEMS: At risk for suicide  Depression  Anxiety  "Be able to control my anxiety"  "Get some mental health help and be able to support myself"             DISCHARGE CRITERIA:  Ability to meet basic life and health needs Improved stabilization in mood, thinking, and/or behavior Motivation to continue treatment in a less acute level of care Need for constant or close observation no longer present  PRELIMINARY DISCHARGE PLAN: Outpatient therapy Return to previous living arrangement  PATIENT/FAMILY INVOLVEMENT: This treatment plan has been presented to and reviewed with the patient, Russell RakersJose R Calhoun.  The patient and family have been given the opportunity to ask questions and make suggestions.  Carleene OverlieMiddleton, Chrystal Zeimet P, RN 02/20/2016, 6:27 PM

## 2016-02-20 NOTE — BH Assessment (Signed)
[facility] Assessment Progress Note  Per Thedore MinsMojeed Akintayo, MD, this pt requires psychiatric hospitalization at this time.  Berneice Heinrichina Tate, RN, [facility].  Pt has signed Voluntary Admission and Consent for Treatment, as well as Consent to Release Information to his fiancee, and to [facility] in [place], and a notification call has been placed to the latter.  Signed forms [facility].  Pt's nurse, Aram BeechamCynthia, has been notified, and agrees to send original paperwork along with pt via [place], and to call report to [phone].  Doylene Canninghomas Benen Weida, MA Triage Specialist [phone]
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
## 2016-02-20 NOTE — ED Provider Notes (Signed)
3:50 AM  Pt here after suicide attempt. Medically cleared. Resting comfortably without complaints. TTS has evaluated patient and they recommend inpatient psychiatric care. [facility] an inpatient psychiatric bed is open for patient. He is comfortable with this plan.   Russell MawKristen N Lassie Demorest, DO 02/20/16 [phone]

## 2016-02-20 NOTE — Progress Notes (Signed)
D: Pt denies SI/HI/AVH. Pt is pleasant and cooperative. Pt stated he was just feeling depressed, and things going on that stressed him .   A: Pt was offered support and encouragement. Pt was given scheduled medications. Pt was encourage to attend groups. Q 15 minute checks were done for safety.   R:Pt attends groups and interacts well with peers and staff. Pt is taking medication. Pt has no complaints.Pt receptive to treatment and safety maintained on unit.

## 2016-02-20 NOTE — Progress Notes (Signed)
Admission Note:  46 year old male who presents voluntary, in no acute distress, for the treatment of SI, Depression, and Anxiety following an intentional overdose of Xanax and a plan to jump off of his balcony. Patient appears flat and depressed. Patient was calm and cooperative with admission process. Patient contracts for safety upon admission. Patient denies AVH.  Patient reports hx of manic depression and anxiety. Patient states that his anxiety is getting worse. Patient identifies stressor as "I lost my job and have a little girl to take care of".  Patient verbalizes, due to financial issues, patient has been feeling hopeless and has not been sleeping for days "I've been taking medication everyday and only getting 2 hours of sleep".  Additionally, patient reports recent breakup with ex girlfriend and prior suicide attempt in 2005.  Patient reports hx of steroid use "off and on for 15 years" with last use 2 years ago.  Patient lives with his new girlfriend and identifies his girlfriend as his support system. [facility] would like to "be able to control my anxiety" and "Get some mental health help and be able to support myself".  Skin was assessed and found to be clear of any abnormal marks apart from an abrasion on forehead. Patient searched and no contraband found, POC and unit policies explained and understanding verbalized. Consents obtained. Food and fluids offered.  Patient had no additional questions or concerns.

## 2016-02-20 NOTE — ED Notes (Signed)
Bed: [id number] Expected date:  Expected time:  Means of arrival:  [facility]

## 2016-02-20 NOTE — Progress Notes (Signed)
Adult Psychoeducational Group Note  Date:  02/20/2016 Time:  9:41 PM  Group Topic/Focus:  Wrap-Up Group:   The focus of this group is to help patients review their daily goal of treatment and discuss progress on daily workbooks.  Participation Level:  Active  Participation Quality:  Appropriate  Affect:  Appropriate  Cognitive:  Alert  Insight: Appropriate  Engagement in Group:  Engaged  Modes of Intervention:  Discussion  Additional Comments:  Patient states, "I had a good day". Patient's goal for today was to get here.  Russell Calhoun 02/20/2016, 9:41 PM

## 2016-02-21 DIAGNOSIS — Z79899 Other long term (current) drug therapy: Secondary | ICD-10-CM

## 2016-02-21 DIAGNOSIS — F332 Major depressive disorder, recurrent severe without psychotic features: Principal | ICD-10-CM

## 2016-02-21 DIAGNOSIS — Z830 Family history of human immunodeficiency virus [HIV] disease: Secondary | ICD-10-CM

## 2016-02-21 DIAGNOSIS — Z818 Family history of other mental and behavioral disorders: Secondary | ICD-10-CM

## 2016-02-21 MED ORDER — VITAMIN B-1 100 MG PO TABS
100.0000 mg | ORAL_TABLET | Freq: Every day | ORAL | Status: DC
  Administered 2016-02-22 – 2016-02-26 (×5): 100 mg via ORAL
  Filled 2016-02-21 (×7): qty 1

## 2016-02-21 MED ORDER — LORAZEPAM 1 MG PO TABS
1.0000 mg | ORAL_TABLET | Freq: Three times a day (TID) | ORAL | Status: AC
  Administered 2016-02-22 – 2016-02-23 (×3): 1 mg via ORAL
  Filled 2016-02-21 (×3): qty 1

## 2016-02-21 MED ORDER — ADULT MULTIVITAMIN W/MINERALS CH
1.0000 | ORAL_TABLET | Freq: Every day | ORAL | Status: DC
  Administered 2016-02-21 – 2016-02-26 (×6): 1 via ORAL
  Filled 2016-02-21 (×9): qty 1

## 2016-02-21 MED ORDER — QUETIAPINE FUMARATE ER 50 MG PO TB24
50.0000 mg | ORAL_TABLET | Freq: Every day | ORAL | Status: DC
  Filled 2016-02-21 (×2): qty 1

## 2016-02-21 MED ORDER — ONDANSETRON 4 MG PO TBDP: 4.0000 mg | ORAL_TABLET | Freq: Four times a day (QID) | ORAL | Status: AC | PRN

## 2016-02-21 MED ORDER — LORAZEPAM 1 MG PO TABS
1.0000 mg | ORAL_TABLET | Freq: Four times a day (QID) | ORAL | Status: AC
  Administered 2016-02-21 – 2016-02-22 (×4): 1 mg via ORAL
  Filled 2016-02-21 (×4): qty 1

## 2016-02-21 MED ORDER — LORAZEPAM 1 MG PO TABS: 1.0000 mg | ORAL_TABLET | Freq: Four times a day (QID) | ORAL | Status: AC | PRN

## 2016-02-21 MED ORDER — LORAZEPAM 1 MG PO TABS
1.0000 mg | ORAL_TABLET | Freq: Two times a day (BID) | ORAL | Status: AC
  Administered 2016-02-23 – 2016-02-24 (×2): 1 mg via ORAL
  Filled 2016-02-21 (×2): qty 1

## 2016-02-21 MED ORDER — THIAMINE HCL 100 MG/ML IJ SOLN
100.0000 mg | Freq: Once | INTRAMUSCULAR | Status: AC
  Administered 2016-02-21: 100 mg via INTRAMUSCULAR
  Filled 2016-02-21: qty 2

## 2016-02-21 MED ORDER — LORAZEPAM 1 MG PO TABS
1.0000 mg | ORAL_TABLET | Freq: Every day | ORAL | Status: AC
  Administered 2016-02-25: 1 mg via ORAL
  Filled 2016-02-21: qty 1

## 2016-02-21 MED ORDER — QUETIAPINE FUMARATE 25 MG PO TABS
25.0000 mg | ORAL_TABLET | Freq: Every day | ORAL | Status: DC
  Administered 2016-02-21: 25 mg via ORAL
  Filled 2016-02-21 (×4): qty 1

## 2016-02-21 MED ORDER — VENLAFAXINE HCL ER 150 MG PO CP24
150.0000 mg | ORAL_CAPSULE | Freq: Every day | ORAL | Status: DC
  Administered 2016-02-22 – 2016-02-26 (×5): 150 mg via ORAL
  Filled 2016-02-21: qty 7
  Filled 2016-02-21 (×6): qty 1

## 2016-02-21 MED ORDER — HYDROXYZINE HCL 25 MG PO TABS
25.0000 mg | ORAL_TABLET | Freq: Four times a day (QID) | ORAL | Status: AC | PRN
  Administered 2016-02-22 – 2016-02-23 (×3): 25 mg via ORAL
  Filled 2016-02-21 (×4): qty 1

## 2016-02-21 MED ORDER — LOPERAMIDE HCL 2 MG PO CAPS: 2.0000 mg | ORAL_CAPSULE | ORAL | Status: AC | PRN

## 2016-02-21 NOTE — Telephone Encounter (Signed)
Patient is currently Admitted in Behavioral Health [hospital] as of 02/20/16 evening [in chart]/SLS 01/24

## 2016-02-21 NOTE — [facility] Group Notes (Signed)
[facility] LCSW Group Therapy 02/21/2016 1:15 PM  Type of Therapy: Group Therapy- Emotion Regulation  Participation Level: Reserved  Participation Quality:  Attentive  Affect: Appropriate  Cognitive: Alert and Oriented   Insight:  Developing/Improving  Engagement in Therapy: Developing/Improving and Engaged   Modes of Intervention: Clarification, Confrontation, Discussion, Education, Exploration, Limit-setting, Orientation, Problem-solving, Rapport Building, Dance movement psychotherapisteality Testing, Socialization and Support  Summary of Progress/Problems: The topic for group today was emotional regulation. This group focused on both positive and negative emotion identification and allowed group members to process ways to identify feelings, regulate negative emotions, and find healthy ways to manage internal/external emotions. Group members were asked to reflect on a time when their reaction to an emotion led to a negative outcome and explored how alternative responses using emotion regulation would have benefited them. Group members were also asked to discuss a time when emotion regulation was utilized when a negative emotion was experienced.   Vernie ShanksLauren Alfie Rideaux, LCSW 02/21/2016 4:38 PM
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
## 2016-02-21 NOTE — Progress Notes (Signed)
D: Patient is up in the day room.  He is interacting appropriately with his peers.  Patient states he has been taking benzos "off the street."  He has also been taking steroids.  Patient has been put on ativan protocol for withdrawals.  Patient denies any thoughts of self harm today.  He rates his depression and hopelessness as an 8; anxiety as a 2. He denies any withdrawal symptoms.  He is pleasant; his mood depressed.   A: Continue to monitor medication management and MD orders.  Safety checks completed every 15 minutes per protocol.  Offer support and encouragement as needed. R: Patient is receptive to staff; his behavior is appropriate.

## 2016-02-21 NOTE — H&P (Signed)
Psychiatric Admission Assessment Adult  Patient Identification: Russell Calhoun MRN:  [id number] Date of Evaluation:  02/21/2016 Chief Complaint:  " I tried to kill myself "  Principal Diagnosis:  Major Depression, Recurrent, Severe, No Psychotic Features Diagnosis:   Patient Active Problem List   Diagnosis Date Noted  . Major depressive disorder, recurrent severe without psychotic features ([place]) [F33.2] 02/20/2016  . Generalized anxiety disorder [F41.1] 05/23/2014  . Erectile dysfunction [N52.9] 05/23/2014  . Insomnia [G47.00] 05/23/2014  . Gastroenteritis [K52.9] 05/23/2014   History of Present Illness:46 year old male. States he has been struggling with depression and anxiety for months, but that these have been worsening recently. He states he has been having increasingly frequent suicidal ideations over recent weeks. He states he had checked into a hotel with thoughts of suicide, jumping off a balcony. He overdosed on about 10-15 xanax tablets ( 2 mgr ?) , which he states he procured on street. Patient states his memory of events after overdose is limited, but he thinks he called his GF who then brought him to hospital. This occurred on 1/22.  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  Associated Signs/Symptoms: Depression Symptoms:  depressed mood, anhedonia, insomnia, suicidal thoughts with specific plan, suicidal attempt, loss of energy/fatigue, decreased appetite, weight loss  (Hypo) Manic Symptoms:  Denies  Anxiety Symptoms:  Describes excessive anxiety, worry. Psychotic Symptoms:  Denies  PTSD Symptoms: Describes PTSD symptoms from childhood trauma, describes some intrusive ruminations, memories, difficulties with trust, some avoidance, denies nightmares  Total Time spent with patient: 45 minutes   Past Psychiatric History:  No prior psychiatric admissions,  one prior suicide attempt by overdosing several years ago, no history of self cutting, no history of self injurious behaviors, no history of psychosis, does not endorse history of mania, hypomania, reports history of chronic  excessive anxiety,worrying  suggestive of GAD. Denies Panic Disorder, denies history of violence   Is the patient at risk to self? Yes.    Has the patient been a risk to self in the past 6 months? Yes.    Has the patient been a risk to self within the distant past? Yes.    Is the patient a risk to others? No.  Has the patient been a risk to others in the past 6 months? No.  Has the patient been a risk to others within the distant past? No.   Prior Inpatient Therapy:  none  Prior Outpatient Therapy:  was getting psychiatric medications via urgent care, had recently started seeing a psychiatrist , saw her only once, does not remember name.  Alcohol Screening: 1. How often do you have a drink containing alcohol?: Never 9. Have you or someone else been injured as a result of your drinking?: No 10. Has a relative or friend or a doctor or another health worker been concerned about your drinking or suggested you cut down?: No Alcohol Use Disorder Identification Test Final Score (AUDIT): 0 Brief Intervention: AUDIT score less than 7 or less-screening does not suggest unhealthy drinking-brief intervention not indicated Substance Abuse History in the last 12 months: denies alcohol abuse, history of benzodiazepine dependence, mainly over the last years, using 4 mgrs daily on average.  Reports occasional cannabis use, but has decreased.  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 Consequences of Substance Abuse: Recent black out, denies other negative consequences, denies  any history of DUI or seizures  Previous Psychotropic Medications: in the past has been on Zoloft.  Patient states he was recently prescribed  Lexapro, Effexor XR, Buspar, states he was taking  regularly - as per home med list was also prescribed Depakote, Remeron, Trazodone , which he states he was not taking  Psychological Evaluations: No  Past Medical History:  Past Medical History:  Diagnosis Date  . Anxiety   . Depression   . Substance abuse    History reviewed. No pertinent surgical history. Family History: Mother deceased - committed suicide, father deceased, died from [place], has one brother, one half sister . Family History  Problem Relation Age of Onset  . Mental illness Mother   . HIV/AIDS Father    Family Psychiatric  History: mother had depression, and  committed suicide by jumping off a height when patient was 35. History of opiate dependence ( aunt , cousin)  Tobacco Screening: Have you used any form of tobacco in the last 30 days? 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 History  Alcohol Use  . Yes     History  Drug Use  . Types: Anabolic steroids    Comment: Cycles on before competition. But none recetnly.    Additional Social History:  Allergies:  No Known Allergies Lab Results:  Results for orders placed or performed during the hospital encounter of 02/19/16 (from the past 48 hour(s))  Comprehensive metabolic panel     Status: Abnormal   Collection Time: 02/19/16 10:47 PM  Result Value Ref Range   Sodium 134 (L) 135 - 145 mmol/L   Potassium 3.5 3.5 - 5.1 mmol/L   Chloride 98 (L) 101 - 111 mmol/L   CO2 29 22 - 32 mmol/L   Glucose, Bld 121 (H) 65 - 99 mg/dL   BUN 13 6 - 20 mg/dL   Creatinine, Ser 1.19 0.61 - 1.24 mg/dL   Calcium 8.9 8.9 - 10.3 mg/dL   Total Protein 7.2 6.5 - 8.1 g/dL   Albumin 4.2 3.5 - 5.0 g/dL   AST 22 15 - 41 U/L   ALT 15 (L)  17 - 63 U/L   Alkaline Phosphatase 63 38 - 126 U/L   Total Bilirubin 0.8 0.3 - 1.2 mg/dL   GFR calc non Af Amer >60 >60 mL/min   GFR calc Af Amer >60 >60 mL/min    Comment: (NOTE) The eGFR has been calculated using the CKD EPI equation. This calculation has not been validated in all clinical situations. eGFR's persistently <60 mL/min signify possible Chronic Kidney Disease.    Anion gap 7 5 - 15  Ethanol     Status: None   Collection Time: 02/19/16 10:47 PM  Result Value Ref Range   Alcohol, Ethyl (B) <5 <5 mg/dL    Comment:        LOWEST DETECTABLE LIMIT FOR SERUM ALCOHOL IS 5 mg/dL FOR MEDICAL PURPOSES ONLY   cbc     Status: None   Collection Time: 02/19/16 10:47 PM  Result Value Ref Range   WBC 10.1 4.0 - 10.5 K/uL   RBC 4.92 4.22 - 5.81 MIL/uL   Hemoglobin 14.6 13.0 - 17.0 g/dL   HCT  42.6 39.0 - 52.0 %   MCV 86.6 78.0 - 100.0 fL   MCH 29.7 26.0 - 34.0 pg   MCHC 34.3 30.0 - 36.0 g/dL   RDW 12.5 11.5 - 15.5 %   Platelets 237 150 - 400 K/uL  Acetaminophen level     Status: Abnormal   Collection Time: 02/19/16 10:47 PM  Result Value Ref Range   Acetaminophen (Tylenol), Serum <10 (L) 10 - 30 ug/mL    Comment:        THERAPEUTIC CONCENTRATIONS VARY SIGNIFICANTLY. A RANGE OF 10-30 ug/mL MAY BE AN EFFECTIVE CONCENTRATION FOR MANY PATIENTS. HOWEVER, SOME ARE BEST TREATED AT CONCENTRATIONS OUTSIDE THIS RANGE. ACETAMINOPHEN CONCENTRATIONS >150 ug/mL AT 4 HOURS AFTER INGESTION AND >50 ug/mL AT 12 HOURS AFTER INGESTION ARE OFTEN ASSOCIATED WITH TOXIC REACTIONS.   Salicylate level     Status: None   Collection Time: 02/19/16 10:47 PM  Result Value Ref Range   Salicylate Lvl <6.1 2.8 - 30.0 mg/dL  Rapid urine drug screen (hospital performed)     Status: Abnormal   Collection Time: 02/20/16  2:26 AM  Result Value Ref Range   Opiates NONE DETECTED NONE DETECTED   Cocaine NONE DETECTED NONE DETECTED   Benzodiazepines POSITIVE (A) NONE DETECTED   Amphetamines NONE  DETECTED NONE DETECTED   Tetrahydrocannabinol POSITIVE (A) NONE DETECTED   Barbiturates NONE DETECTED NONE DETECTED    Comment:        DRUG SCREEN FOR MEDICAL PURPOSES ONLY.  IF CONFIRMATION IS NEEDED FOR ANY PURPOSE, NOTIFY LAB WITHIN 5 DAYS.        LOWEST DETECTABLE LIMITS FOR URINE DRUG SCREEN Drug Class       Cutoff (ng/mL) Amphetamine      1000 Barbiturate      200 Benzodiazepine   607 Tricyclics       371 Opiates          300 Cocaine          300 THC              50     Blood Alcohol level:  Lab Results  Component Value Date   ETH <5 07/24/9483    Metabolic Disorder Labs:  No results found for: HGBA1C, MPG No results found for: PROLACTIN No results found for: CHOL, TRIG, HDL, CHOLHDL, VLDL, LDLCALC  Current Medications: Current Facility-Administered Medications  Medication Dose Route Frequency Provider Last Rate Last Dose  . acetaminophen (TYLENOL) tablet 650 mg  650 mg Oral Q4H PRN Patrecia Pour, NP      . alum & mag hydroxide-simeth (MAALOX/MYLANTA) 200-200-20 MG/5ML suspension 30 mL  30 mL Oral PRN Patrecia Pour, NP      . divalproex (DEPAKOTE) DR tablet 250 mg  250 mg Oral BID Patrecia Pour, NP   250 mg at 02/21/16 0745  . escitalopram (LEXAPRO) tablet 20 mg  20 mg Oral Daily Patrecia Pour, NP   20 mg at 02/21/16 0744  . ibuprofen (ADVIL,MOTRIN) tablet 600 mg  600 mg Oral Q8H PRN Patrecia Pour, NP      . magnesium hydroxide (MILK OF MAGNESIA) suspension 30 mL  30 mL Oral Daily PRN Patrecia Pour, NP      . ondansetron [facility]) tablet 4 mg  4 mg Oral Q8H PRN Patrecia Pour, NP      . traZODone (DESYREL) tablet 100 mg  100 mg Oral QHS PRN Encarnacion Slates, NP   100 mg at 02/20/16  2249  . venlafaxine XR (EFFEXOR-XR) 24 hr capsule 75 mg  75 mg Oral Q breakfast Patrecia Pour, NP   75 mg at 02/21/16 0744   PTA Medications: Prescriptions Prior to Admission  Medication Sig Dispense Refill Last Dose  . acetaminophen (TYLENOL) 500 MG tablet Take 500 mg by mouth  every 6 (six) hours as needed. For headache.   Past Month at Unknown time  . Ascorbic Acid (VITAMIN C) 1000 MG tablet Take 2,000 mg by mouth daily.   Past Week at Unknown time  . busPIRone (BUSPAR) 7.5 MG tablet 1 tab po bid (Patient not taking: Reported on 02/20/2016) 60 tablet 0 Not Taking at Unknown time  . divalproex (DEPAKOTE) 250 MG DR tablet Take 250 mg by mouth 2 (two) times daily.   Past Week at Unknown time  . escitalopram (LEXAPRO) 20 MG tablet Take 20 mg by mouth daily.   Past Week at Unknown time  . mirtazapine (REMERON) 30 MG tablet Take 1 tablet by mouth at bedtime.  1 Past Week at Unknown time  . sildenafil (VIAGRA) 100 MG tablet Take 1 tablet (100 mg total) by mouth daily as needed for erectile dysfunction. 8 tablet 3 unknown  . traZODone (DESYREL) 100 MG tablet Take 1 tablet (100 mg total) by mouth at bedtime as needed for sleep. 30 tablet 0 Past Week at Unknown time  . venlafaxine XR (EFFEXOR XR) 75 MG 24 hr capsule Take 1 capsule (75 mg total) by mouth daily with breakfast. 30 capsule 0 Past Week at Unknown time    Musculoskeletal: Strength & Muscle Tone: within normal limits - minimal distal tremors , no diaphoresis. Gait & Station: normal Patient leans: N/A  Psychiatric Specialty Exam: Physical Exam  Review of Systems  Constitutional: Negative.   HENT: Negative.   Eyes: Negative.   Respiratory: Negative.   Cardiovascular: Negative.   Gastrointestinal: Negative.   Genitourinary: Negative.   Musculoskeletal: Negative.   Skin: Negative.   Neurological: Negative for seizures.  Endo/Heme/Allergies: Negative.   Psychiatric/Behavioral: Positive for depression, substance abuse and suicidal ideas. The patient is nervous/anxious and has insomnia.   All other systems reviewed and are negative.   Blood pressure 96/61, pulse 75, temperature 98.3 F (36.8 C), temperature source Oral, resp. rate 18, height '5\' 3"'$  (1.6 m), weight 72.6 kg (160 lb).Body mass index is 28.34 kg/m.   General Appearance: Well Groomed  Eye Contact:  Good  Speech:  Normal Rate  Volume:  Decreased  Mood:  Anxious and Depressed  Affect:  Constricted  Thought Process:  Linear  Orientation:  Other:  fully alert and attentive   Thought Content:  no hallucinations, no delusions, not internally preoccupied   Suicidal Thoughts:  No- denies current suicidal ideations , denies any self injurious ideations, contracts for safety on the unit   Homicidal Thoughts:  No  Memory:  recent and remote grossly intact   Judgement:  Fair  Insight:  Fair  Psychomotor Activity:  Decreased- no restlessness, no agitation, slight distal tremors   Concentration:  Concentration: Good and Attention Span: Good  Recall:  Good  Fund of Knowledge:  Good  Language:  Good  Akathisia:  Negative  Handed:  Right  AIMS (if indicated):     Assets:  Desire for Improvement Resilience  ADL's:  Intact  Cognition:  WNL  Sleep:  Number of Hours: 6.75    Treatment Plan Summary: Daily contact with patient to assess and evaluate symptoms and progress in treatment, Medication  management, Plan inpatient admission and medications as below  Observation Level/Precautions:  15 minute checks  Laboratory:  as needed   Psychotherapy:  Milieu, support, group therapy   Medications:    Start Ativan detox protocol to minimize risk of BZD withdrwal.  D/C Depakote- patient was not taking regularly, does not endorse history of seizure disorder , explosiveness, or bipolarity  D/C Lexapro- states has been taking for about a month,in spite of which has been feeling more depressed  Increase Effexor XR to 150 mgrs QDAY for depression, anxiety     Consultations:  As needed   Discharge Concerns:  -   Estimated LOS: 6 days   Other:     Physician Treatment Plan for Primary Diagnosis: Major Depression Long Term Goal(s): Improvement in symptoms so as ready for discharge  Short Term Goals: Ability to disclose and discuss suicidal ideas,  Ability to demonstrate self-control will improve, Ability to identify and develop effective coping behaviors will improve, Ability to maintain clinical measurements within normal limits will improve and Compliance with prescribed medications will improve  Physician Treatment Plan for Secondary Diagnosis: Benzodiazepine Dependence   Long Term Goal(s): Improvement in symptoms so as ready for discharge  Short Term Goals: Ability to identify triggers associated with substance abuse/mental health issues will improve  I certify that inpatient services furnished can reasonably be expected to improve the patient's condition.    Neita Garnet, MD 1/24/20188:43 AM

## 2016-02-21 NOTE — Plan of Care (Signed)
Problem: Activity: Goal: Interest or engagement in activities will improve Outcome: Progressing Patient is attending groups and participating.   

## 2016-02-21 NOTE — [facility] Suicide Risk Assessment (Signed)
[facility]   Nursing information obtained from:  Patient Demographic factors:  Male Current Mental Status:  Suicidal ideation indicated by patient, Suicide plan, Plan includes specific time, place, or method, Self-harm thoughts, Self-harm behaviors, Intention to act on suicide plan Loss Factors:  Decrease in vocational status, Loss of significant relationship, Financial problems / change in socioeconomic status Historical Factors:  Prior suicide attempts, Family history of suicide, Domestic violence Risk Reduction Factors:  Responsible for children under 48 years of age, Sense of responsibility to family, Living with another person, especially a relative, Positive social support  Total Time spent with patient: 45 minutes Principal Problem:  MDD, BZD Dependence Diagnosis:   Patient Active Problem List   Diagnosis Date Noted  . Major depressive disorder, recurrent severe without psychotic features ([facility]) [F33.2] 02/20/2016  . Generalized anxiety disorder [F41.1] 05/23/2014  . Erectile dysfunction [N52.9] 05/23/2014  . Insomnia [G47.00] 05/23/2014  . Gastroenteritis [K52.9] 05/23/2014     Continued Clinical Symptoms:  Alcohol Use Disorder Identification Test Final Score (AUDIT): 0 The "Alcohol Use Disorders Identification Test", Guidelines for Use in Primary Care, Second Edition.  World Science writerHealth Organization [facility](WHO). Score between 0-7:  no or low risk or alcohol related problems. Score between 8-15:  moderate risk of alcohol related problems. Score between 16-19:  high risk of alcohol related problems. Score 20 or above:  warrants further diagnostic evaluation for alcohol dependence and treatment.   CLINICAL FACTORS:  46 year old male , presents with worsening depression, suicidal ideations, with plan of jumping off a balcony. Overdosed on Xanax prior to admission and reports BZD dependence history.  Psychiatric Specialty Exam: Physical Exam  ROS  Blood  pressure 137/70, pulse (!) 55, temperature 98.3 F (36.8 C), temperature source Oral, resp. rate 18, height 5\' 3"  (1.6 m), weight 72.6 kg (160 lb).Body mass index is 28.34 kg/m.   see admit note MSE    COGNITIVE FEATURES THAT CONTRIBUTE TO RISK:  Decreased level of functioning related to severe depression  SUICIDE RISK:   Moderate:  Frequent suicidal ideation with limited intensity, and duration, some specificity in terms of plans, no associated intent, good self-control, limited dysphoria/symptomatology, some risk factors present, and identifiable protective factors, including available and accessible social support.  PLAN OF CARE: Patient will be admitted to inpatient psychiatric unit for stabilization and safety. Will provide and encourage milieu participation. Provide medication management and maked adjustments as needed. Will also provide medication management to minimize risk of BZD withdrawal.   Will follow daily.    I certify that inpatient services furnished can reasonably be expected to improve the patient's condition.   Nehemiah MassedOBOS, Corbyn Wildey, MD 02/21/2016, 11:36 AM

## 2016-02-21 NOTE — Progress Notes (Signed)
Adult Psychoeducational Group Note  Date:  02/21/2016 Time:  9:03 PM  Group Topic/Focus:  Wrap-Up Group:   The focus of this group is to help patients review their daily goal of treatment and discuss progress on daily workbooks.  Participation Level:  Active  Participation Quality:  Appropriate  Affect:  Appropriate  Cognitive:  Appropriate  Insight: Appropriate  Engagement in Group:  Engaged  Modes of Intervention:  Discussion  Additional Comments:  Patient attended group and said that his day was a 7.  Something positive: He said he is going to get some sleep tonight because his medications were changed.   Norval Slaven W Maxfield Gildersleeve 02/21/2016, 9:03 PM

## 2016-02-21 NOTE — Progress Notes (Signed)
Recreation Therapy Notes  Date: 02/21/16 Time: 0930 Location: [address] Dayroom  Group Topic: Stress Management  Goal Area(s) Addresses:  Patient will verbalize importance of using healthy stress management.  Patient will identify positive emotions associated with healthy stress management.   Intervention: Stress Management  Activity :  Body Scan Meditation.  LRT introduced the stress management technique of meditation.  LRT played a meditation from the calm app that allowed patients the opportunity to focus on any tension they may be experiencing in their bodies.  Patients were to follow along with the meditation.  Education:  Stress Management, Discharge Planning.   Education Outcome: Acknowledges edcuation/In group clarification offered/Needs additional education  Clinical Observations/Feedback: Pt did not attend group.    Caroll RancherMarjette Phillippa Straub, LRT/CTRS         Caroll RancherLindsay, Riker Collier A 02/21/2016 2:56 PM

## 2016-02-21 NOTE — Tx Team (Signed)
Interdisciplinary Treatment and Diagnostic Plan Update  02/21/2016 Time of Session: 4:33 PM  Russell Calhoun MRN: [id number]  Principal Diagnosis: Major depressive disorder, recurrent severe without psychotic features [facility])  Secondary Diagnoses: Principal Problem:   Major depressive disorder, recurrent severe without psychotic features ([place])   Current Medications:  Current Facility-Administered Medications  Medication Dose Route Frequency Provider Last Rate Last Dose  . acetaminophen (TYLENOL) tablet 650 mg  650 mg Oral Q4H PRN Patrecia Pour, NP      . alum & mag hydroxide-simeth (MAALOX/MYLANTA) 200-200-20 MG/5ML suspension 30 mL  30 mL Oral PRN Patrecia Pour, NP      . hydrOXYzine (ATARAX/VISTARIL) tablet 25 mg  25 mg Oral Q6H PRN Jenne Campus, MD      . ibuprofen (ADVIL,MOTRIN) tablet 600 mg  600 mg Oral Q8H PRN Patrecia Pour, NP      . loperamide (IMODIUM) capsule 2-4 mg  2-4 mg Oral PRN Jenne Campus, MD      . LORazepam (ATIVAN) tablet 1 mg  1 mg Oral Q6H PRN Jenne Campus, MD      . LORazepam (ATIVAN) tablet 1 mg  1 mg Oral QID Jenne Campus, MD   1 mg at 02/21/16 1052   Followed by  . [START ON 02/22/2016] LORazepam (ATIVAN) tablet 1 mg  1 mg Oral TID Jenne Campus, MD       Followed by  . [START ON 02/23/2016] LORazepam (ATIVAN) tablet 1 mg  1 mg Oral BID Jenne Campus, MD       Followed by  . [START ON 02/25/2016] LORazepam (ATIVAN) tablet 1 mg  1 mg Oral Daily Fernando A Cobos, MD      . magnesium hydroxide (MILK OF MAGNESIA) suspension 30 mL  30 mL Oral Daily PRN Patrecia Pour, NP      . multivitamin with minerals tablet 1 tablet  1 tablet Oral Daily Jenne Campus, MD   1 tablet at 02/21/16 1052  . ondansetron (ZOFRAN-ODT) disintegrating tablet 4 mg  4 mg Oral Q6H PRN Jenne Campus, MD      . QUEtiapine (SEROQUEL) tablet 25 mg  25 mg Oral QHS Jenne Campus, MD      . Derrill Memo ON 02/22/2016] thiamine (VITAMIN B-1) tablet 100 mg  100 mg Oral Daily  Jenne Campus, MD      . Derrill Memo ON 02/22/2016] venlafaxine XR (EFFEXOR-XR) 24 hr capsule 150 mg  150 mg Oral Q breakfast Jenne Campus, MD        PTA Medications: Prescriptions Prior to Admission  Medication Sig Dispense Refill Last Dose  . acetaminophen (TYLENOL) 500 MG tablet Take 500 mg by mouth every 6 (six) hours as needed. For headache.   Past Month at Unknown time  . Ascorbic Acid (VITAMIN C) 1000 MG tablet Take 2,000 mg by mouth daily.   Past Week at Unknown time  . busPIRone (BUSPAR) 7.5 MG tablet 1 tab po bid (Patient not taking: Reported on 02/20/2016) 60 tablet 0 Not Taking at Unknown time  . divalproex (DEPAKOTE) 250 MG DR tablet Take 250 mg by mouth 2 (two) times daily.   Past Week at Unknown time  . escitalopram (LEXAPRO) 20 MG tablet Take 20 mg by mouth daily.   Past Week at Unknown time  . mirtazapine (REMERON) 30 MG tablet Take 1 tablet by mouth at bedtime.  1 Past Week at Unknown time  . sildenafil (VIAGRA) 100  MG tablet Take 1 tablet (100 mg total) by mouth daily as needed for erectile dysfunction. 8 tablet 3 unknown  . traZODone (DESYREL) 100 MG tablet Take 1 tablet (100 mg total) by mouth at bedtime as needed for sleep. 30 tablet 0 Past Week at Unknown time  . venlafaxine XR (EFFEXOR XR) 75 MG 24 hr capsule Take 1 capsule (75 mg total) by mouth daily with breakfast. 30 capsule 0 Past Week at Unknown time    Treatment Modalities: Medication Management, Group therapy, Case management,  1 to 1 session with clinician, Psychoeducation, Recreational therapy.  Patient Stressors: Financial difficulties Occupational concerns  Patient Strengths: Ability for Estate manager/land agent for treatment/growth Supportive family/friends  Physician Treatment Plan for Primary Diagnosis: Major depressive disorder, recurrent severe without psychotic features ([place]) Long Term Goal(s): Improvement in symptoms so as ready for discharge  Short Term Goals: Ability to  disclose and discuss suicidal ideas Ability to demonstrate self-control will improve Ability to identify and develop effective coping behaviors will improve Ability to maintain clinical measurements within normal limits will improve Compliance with prescribed medications will improve Ability to identify triggers associated with substance abuse/mental health issues will improve  Medication Management: Evaluate patient's response, side effects, and tolerance of medication regimen.  Therapeutic Interventions: 1 to 1 sessions, Unit Group sessions and Medication administration.  Evaluation of Outcomes: Not Met  Physician Treatment Plan for Secondary Diagnosis: Principal Problem:   Major depressive disorder, recurrent severe without psychotic features ([place])   Long Term Goal(s): Improvement in symptoms so as ready for discharge  Short Term Goals: Ability to disclose and discuss suicidal ideas Ability to demonstrate self-control will improve Ability to identify and develop effective coping behaviors will improve Ability to maintain clinical measurements within normal limits will improve Compliance with prescribed medications will improve Ability to identify triggers associated with substance abuse/mental health issues will improve  Medication Management: Evaluate patient's response, side effects, and tolerance of medication regimen.  Therapeutic Interventions: 1 to 1 sessions, Unit Group sessions and Medication administration.  Evaluation of Outcomes: Not Met   RN Treatment Plan for Primary Diagnosis: Major depressive disorder, recurrent severe without psychotic features ([place]) Long Term Goal(s): Knowledge of disease and therapeutic regimen to maintain health will improve  Short Term Goals: Ability to verbalize feelings will improve, Ability to disclose and discuss suicidal ideas and Ability to identify and develop effective coping behaviors will improve  Medication Management: RN will  administer medications as ordered by provider, will assess and evaluate patient's response and provide education to patient for prescribed medication. RN will report any adverse and/or side effects to prescribing provider.  Therapeutic Interventions: 1 on 1 counseling sessions, Psychoeducation, Medication administration, Evaluate responses to treatment, Monitor vital signs and CBGs as ordered, Perform/monitor CIWA, COWS, AIMS and Fall Risk screenings as ordered, Perform wound care treatments as ordered.  Evaluation of Outcomes: Not Met   LCSW Treatment Plan for Primary Diagnosis: Major depressive disorder, recurrent severe without psychotic features ([place]) Long Term Goal(s): Safe transition to appropriate next level of care at discharge, Engage patient in therapeutic group addressing interpersonal concerns.  Short Term Goals: Engage patient in aftercare planning with referrals and resources, Identify triggers associated with mental health/substance abuse issues and Increase skills for wellness and recovery  Therapeutic Interventions: Assess for all discharge needs, 1 to 1 time with Social worker, Explore available resources and support systems, Assess for adequacy in community support network, Educate family and significant other(s) on suicide prevention, Complete Psychosocial Assessment,  Interpersonal group therapy.  Evaluation of Outcomes: Not Met   Progress in Treatment: Attending groups: Pt is new to milieu, continuing to assess  Participating in groups: Pt is new to milieu, continuing to assess  Taking medication as prescribed: Yes, MD continues to assess for medication changes as needed Toleration medication: Yes, no side effects reported at this time Family/Significant other contact made: No, CSW assessing for appropriate contact Patient understands diagnosis: Continuing to assess Discussing patient identified problems/goals with staff: Yes Medical problems stabilized or resolved:  Yes Denies suicidal/homicidal ideation: No, recently admitted with SI Issues/concerns per patient self-inventory: None Other: N/A  New problem(s) identified: None identified at this time.   New Short Term/Long Term Goal(s): None identified at this time.   Discharge Plan or Barriers: CSW will assess for appropriate discharge plan and relevant barriers.   Reason for Continuation of Hospitalization: Anxiety Depression Medication stabilization Suicidal ideation Withdrawal symptoms  Estimated Length of Stay: 3-5 days  Attendees: Patient: 02/21/2016  4:33 PM  Physician: Dr. Parke Poisson 02/21/2016  4:33 PM  Nursing: Darrol Angel, RN; Mayra Neer, RN 02/21/2016  4:33 PM  RN Care Manager: Lars Pinks, RN 02/21/2016  4:33 PM  Social Worker: Adriana Reams, LCSW; [place], LCSW 02/21/2016  4:33 PM  Recreational Therapist:  02/21/2016  4:33 PM  Other: Lindell Spar, NP; Samuel Jester, NP 02/21/2016  4:33 PM  Other:  02/21/2016  4:33 PM  Other: 02/21/2016  4:33 PM    Scribe for Treatment Team: Gladstone Lighter, LCSW 02/21/2016 4:33 PM

## 2016-02-22 MED ORDER — QUETIAPINE FUMARATE 50 MG PO TABS
50.0000 mg | ORAL_TABLET | Freq: Once | ORAL | Status: AC
  Administered 2016-02-22: 50 mg via ORAL
  Filled 2016-02-22 (×2): qty 1

## 2016-02-22 MED ORDER — QUETIAPINE FUMARATE 25 MG PO TABS
25.0000 mg | ORAL_TABLET | Freq: Two times a day (BID) | ORAL | Status: DC | PRN
  Administered 2016-02-22 – 2016-02-26 (×8): 25 mg via ORAL
  Filled 2016-02-22 (×8): qty 1

## 2016-02-22 MED ORDER — QUETIAPINE FUMARATE 100 MG PO TABS
100.0000 mg | ORAL_TABLET | Freq: Every day | ORAL | Status: DC
  Administered 2016-02-22 – 2016-02-24 (×3): 100 mg via ORAL
  Filled 2016-02-22 (×5): qty 1

## 2016-02-22 NOTE — [facility] Group Notes (Signed)
[facility] Therapy 02/22/2016 1:15pm  Type of Therapy: Mental Health Association Presentation  Participation Level: Active  Participation Quality: Attentive  Affect: Appropriate  Cognitive: Oriented  Insight: Developing/Improving  Engagement in Therapy: Engaged  Modes of Intervention: Discussion, Education and Socialization  Summary of Progress/Problems: Mental Health Association (MHA) Speaker came to talk about his personal journey with substance abuse and addiction. The pt processed ways by which to relate to the speaker. MHA speaker provided handouts and educational information pertaining to groups and services offered by the [facility]. Pt was engaged in speaker's presentation and was receptive to resources provided.    Vernie ShanksLauren Keilyn Nadal, LCSW 02/22/2016 1:15 PM

## 2016-02-22 NOTE — Progress Notes (Signed)
[facility] MD Progress Note  02/22/2016 3:04 PM Russell Calhoun  MRN:  [id number] Subjective:  "I really did not sleep well last night." Objective:  Patient was admitted after worsening depression and anxiety over te least few months.  He also became dependent on BZD's (Xanax).  He is seen today.  He feels relieved that he is here to get help.  He also states that it is a good time to see if his anti depressants will work because he was on "so many drugs", he states he was not sure if the medication worked.  He is denying SI and HI.  He does c/o of anxiety and "I keep thinking all the time."  Principal Problem: Major depressive disorder, recurrent severe without psychotic features (H[facility]) Diagnosis:   Patient Active Problem List   Diagnosis Date Noted  . Major depressive disorder, recurrent severe without psychotic features (HCC) [F33.2] 02/20/2016  . Generalized anxiety disorder [F41.1] 05/23/2014  . Erectile dysfunction [N52.9] 05/23/2014  . Insomnia [G47.00] 05/23/2014  . Gastroenteritis [K52.9] 05/23/2014   Total Time spent with patient: 30 minutes  Past Psychiatric History: see HPI  Past Medical History:  Past Medical History:  Diagnosis Date  . Anxiety   . Depression   . Substance abuse    History reviewed. No pertinent surgical history. Family History:  Family History  Problem Relation Age of Onset  . Mental illness Mother   . HIV/AIDS Father    Family Psychiatric  History: see HPI Social History:  History  Alcohol Use  . Yes     History  Drug Use  . Types: Anabolic steroids    Comment: Cycles on before competition. But none recetnly.    Social History   Social History  . Marital status: Single    Spouse name: N/A  . Number of children: N/A  . Years of education: N/A   Social History Main Topics  . Smoking status: Never Smoker  . Smokeless tobacco: Never Used  . Alcohol use Yes  . Drug use: Yes    Types: Anabolic steroids     Comment: Cycles on before  competition. But none recetnly.  . Sexual activity: Yes   Other Topics Concern  . None   Social History Narrative  . None   Additional Social History:                         Sleep: Poor  Appetite:  Fair  Current Medications: Current Facility-Administered Medications  Medication Dose Route Frequency Provider Last Rate Last Dose  . acetaminophen (TYLENOL) tablet 650 mg  650 mg Oral Q4H PRN Charm Rings, NP      . alum & mag hydroxide-simeth (MAALOX/MYLANTA) 200-200-20 MG/5ML suspension 30 mL  30 mL Oral PRN Charm Rings, NP      . hydrOXYzine (ATARAX/VISTARIL) tablet 25 mg  25 mg Oral Q6H PRN Craige Cotta, MD   25 mg at 02/22/16 0011  . ibuprofen (ADVIL,MOTRIN) tablet 600 mg  600 mg Oral Q8H PRN Charm Rings, NP      . loperamide (IMODIUM) capsule 2-4 mg  2-4 mg Oral PRN Craige Cotta, MD      . LORazepam (ATIVAN) tablet 1 mg  1 mg Oral Q6H PRN Craige Cotta, MD      . LORazepam (ATIVAN) tablet 1 mg  1 mg Oral TID Craige Cotta, MD   1 mg at 02/22/16 1146   Followed by  . [  START ON 02/23/2016] LORazepam (ATIVAN) tablet 1 mg  1 mg Oral BID Craige Cotta, MD       Followed by  . [START ON 02/25/2016] LORazepam (ATIVAN) tablet 1 mg  1 mg Oral Daily Ajene Carchi A Amyia Lodwick, MD      . magnesium hydroxide (MILK OF MAGNESIA) suspension 30 mL  30 mL Oral Daily PRN Charm Rings, NP      . multivitamin with minerals tablet 1 tablet  1 tablet Oral Daily Craige Cotta, MD   1 tablet at 02/22/16 0800  . ondansetron (ZOFRAN-ODT) disintegrating tablet 4 mg  4 mg Oral Q6H PRN Craige Cotta, MD      . QUEtiapine (SEROQUEL) tablet 100 mg  100 mg Oral QHS Adonis Brook, NP      . QUEtiapine (SEROQUEL) tablet 25 mg  25 mg Oral BID PRN Adonis Brook, NP      . thiamine (VITAMIN B-1) tablet 100 mg  100 mg Oral Daily Rockey Situ Mallary Kreger, MD   100 mg at 02/22/16 0800  . venlafaxine XR (EFFEXOR-XR) 24 hr capsule 150 mg  150 mg Oral Q breakfast Craige Cotta, MD   150 mg at  02/22/16 0800    Lab Results: No results found for this or any previous visit (from the past 48 hour(s)).  Blood Alcohol level:  Lab Results  Component Value Date   ETH <5 02/19/2016    Metabolic Disorder Labs: No results found for: HGBA1C, MPG No results found for: PROLACTIN No results found for: CHOL, TRIG, HDL, CHOLHDL, VLDL, LDLCALC  Physical Findings: AIMS: Facial and Oral Movements Muscles of Facial Expression: None, normal Lips and Perioral Area: None, normal Jaw: None, normal Tongue: None, normal,Extremity Movements Upper (arms, wrists, hands, fingers): None, normal Lower (legs, knees, ankles, toes): None, normal, Trunk Movements Neck, shoulders, hips: None, normal, Overall Severity Severity of abnormal movements (highest score from questions above): None, normal Incapacitation due to abnormal movements: None, normal Patient's awareness of abnormal movements (rate only patient's report): No Awareness, Dental Status Current problems with teeth and/or dentures?: No Does patient usually wear dentures?: No  CIWA:  CIWA-Ar Total: 1 COWS:     Musculoskeletal: Strength & Muscle Tone: within normal limits Gait & Station: normal Patient leans: N/A  Psychiatric Specialty Exam: Physical Exam  Nursing note and vitals reviewed.   ROS  Blood pressure 122/73, pulse 67, temperature 98.8 F (37.1 C), temperature source Oral, resp. rate 18, height 5\' 3"  (1.6 m), weight 72.6 kg (160 lb).Body mass index is 28.34 kg/m.  General Appearance: Well Groomed  Eye Contact:  Good  Speech:  Normal Rate  Volume:  Decreased  Mood:  Anxious  Affect:  Constricted  Thought Process:  Linear  Orientation:  Full (Time, Place, and Person)  Thought Content:  Rumination  Suicidal Thoughts:  No  Homicidal Thoughts:  No  Memory:  Immediate;   Fair Recent;   Fair Remote;   Fair  Judgement:  Fair  Insight:  Good  Psychomotor Activity:  Normal  Concentration:  Concentration: Fair and  Attention Span: Fair  Recall:  [employer] of Knowledge:  Fair  Language:  Good  Akathisia:  No  Handed:  Right  AIMS (if indicated):     Assets:  Resilience Social Support  ADL's:  Intact  Cognition:  WNL  Sleep:  Number of Hours: 2.5   Treatment Plan Summary: Review of chart, vital signs, medications, and notes.  1-Individual and group therapy  2-Medication management for depression and anxiety: Medications reviewed with the patient.  Ativan withdrawal protocol, Seroquel increased to 100 mg at bedtime insomnia, 25 mg BID PRN for anxiety agitation, Effexor XR 150 mg daily for depression. 3-Coping skills for depression, anxiety  4-Continue crisis stabilization and management  5-Address health issues--monitoring vital signs, stable  6-Treatment plan in progress to prevent relapse of depression and anxiety 7- EKG reviewed QTC 451, Discussed with Dr Jama Flavorsobos, proceed with medications ordered  [facility] May Agustin, NP [facility] 02/22/2016, 3:04 PM   Agree with NP Progress Note

## 2016-02-22 NOTE — [facility] Group Notes (Signed)
[facility] Group Notes:  Orientation   Date:  02/22/2016  Time:  11:28 AM  Type of Therapy:  Psychoeducational Skills  Participation Level:  Minimal  Participation Quality:  Appropriate  Affect:  Appropriate  Cognitive:  Appropriate  Insight:  Appropriate  Engagement in Group:  Engaged  Modes of Intervention:  Discussion and Education  Summary of Progress/Problems: Patient attended group and was engaged.   Marzetta BoardDopson, Jamaury Gumz E 02/22/2016, 11:28 AM

## 2016-02-22 NOTE — Progress Notes (Signed)
D: Patient pleasant and cooperative with care this shift and is noted to interact well with peers in the milieu. Pt with complaints of anxiety and inability to sleep at night. Pt given prn meds, which were effective. But, pt later waking up and requesting additional anxiety medications. A: Encourage staff/peer interaction, medication compliance, and group participation. Administer medications as ordered, maintain Q 15 minute safety checks. R: Pt denies SI at this time. No inappropriate behaviors noted. No signs of distress noted.

## 2016-02-22 NOTE — Progress Notes (Signed)
D: Patient complaining of poor sleep.  He took two does of seroquel 50 mg and some prns.  His seroquel has been increased for tonight.  He rates his depression and hopelessness as a 7; anxiety as a 10.  He reports minimal withdrawal symptoms.  He reports withdrawal symptoms as tremors and irritability.  He is doing well on the ativan protocol.  Patient denies any thoughts of self harm.  He is attending groups and participating in his treatment.  His affect is brighter today; his mood is stable.   A: Continue to monitor medication management and MD orders.  Safety checks completed every 15 minutes per protocol.  Offer support and encouragement as needed. R: Patient is receptive to staff; his behavior is appropriate.

## 2016-02-22 NOTE — Progress Notes (Signed)
Patient awake and states he cannot sleep at this time. Donell SievertSpencer Simon, PA notified. Pt given one time dose of Seroquel as ordered. No distress noted.

## 2016-02-22 NOTE — Progress Notes (Signed)
Patient attended group and said that his day was a 10. His goal for tomorrow  Is to speak to the Child psychotherapistocial worker.

## 2016-02-23 NOTE — Progress Notes (Signed)
Adult Psychoeducational Group Note  Date:  02/23/2016 Time:  9:50 PM  Group Topic/Focus:  Wrap-Up Group:   The focus of this group is to help patients review their daily goal of treatment and discuss progress on daily workbooks.  Participation Level:  Active  Participation Quality:  Appropriate  Affect:  Appropriate  Cognitive:  Appropriate  Insight: Appropriate  Engagement in Group:  Engaged  Modes of Intervention:  Discussion  Additional Comments:  Patient attended group and said that his day started as a 9 but changed to a 4 because he felt sick.  One positive thing about his day was his girlfriend came by to visit him.   Aviance [place]wood W Aneisha Skyles 02/23/2016, 9:50 PM

## 2016-02-23 NOTE — Progress Notes (Signed)
Adult Psychoeducational Group Note  Date:  02/23/2016 Time:  11:55 AM  Group Topic/Focus:  Identifying Needs:   The focus of this group is to help patients identify their personal needs that have been historically problematic and identify healthy behaviors to address their needs.  Participation Level:  Active  Participation Quality:  Appropriate  Affect:  Appropriate  Cognitive:  Alert  Insight: Appropriate  Engagement in Group:  Improving  Modes of Intervention:  Discussion  Additional Comments:  Pt attended group this morning.  Pt states that he tried to overdose because he has been suffering with depression and anxiety.  Pt states that he feels much better after talking to doctor and taking medication.  Pt also states that he feels hopeful!  Damyiah Moxley R Carlin Mamone 02/23/2016, 11:55 AM

## 2016-02-23 NOTE — Plan of Care (Signed)
Problem: Medication: Goal: Compliance with prescribed medication regimen will improve Outcome: Progressing Pt has been compliant with scheduled medication tonight.    

## 2016-02-23 NOTE — Progress Notes (Signed)
D: Pt presents with anxious affect and mood.  He describes his day as "so-so."  Pt reports his goal is to "sleep more than 4 hours."  Pt complains of anxiety and racing thoughts.  He reports he had a good visit with his girlfriend tonight.  Denies SI/HI, denies hallucinations, denies pain.  Pt attended evening group.    A: Introduced self to pt.  Medication administered per order.  Encouragement and support offered.  PRN medication administered for racing thoughts and anxiety.  Q15 minute safety checks performed.    R: Pt is compliant with medications.  He is safe on the unit and he verbally contracts for safety.

## 2016-02-23 NOTE — Progress Notes (Signed)
D) Pt has been attending the groups and has been interacting with his peers appropriately throughout the day. Affect and mood are improving. Pt. Denies SI and HI. Pt states that he has been using benzo's to decrease his anxiety. Today Pt is going to try Seroquel and is concerned that he will not be discharged with this medication. Depression is 7, hopelessness is 2 and anxiety is a 7. A) Given support, reassurance and praise.Provided with a brief 1:1.  R) Denis SI and HI. States he is feeling a bit better.

## 2016-02-23 NOTE — Progress Notes (Signed)
D: Patient pleasant and cooperative with care this shift and is noted to interact well with peers in the milieu. Pt requested anxiety medication to help him sleep. Medication effective. A: Encourage staff/peer interaction, medication compliance, and group participation. Administer medications as ordered, maintain Q 15 minute safety checks. R: Pt compliant with medications and attended group session. Pt denies SI at this time and verbally contracts for safety. No signs/symptoms of distress noted.

## 2016-02-23 NOTE — [facility] Suicide Risk Assessment (Signed)
[facility] INPATIENT:  Family/Significant Other Suicide Prevention Education  Suicide Prevention Education:  Education Completed; Kathee PoliteLindsey Idol, Pt's fiancee [phone], has been identified by the patient as the family member/significant other with whom the patient will be residing, and identified as the person(s) who will aid the patient in the event of a mental health crisis (suicidal ideations/suicide attempt).  With written consent from the patient, the family member/significant other has been provided the following suicide prevention education, prior to the and/or following the discharge of the patient.  The suicide prevention education provided includes the following:  Suicide risk factors  Suicide prevention and interventions  National Suicide Hotline telephone number  [facility] assessment telephone number  [facility] 911  [facility] and/or Residential Mobile Crisis Unit telephone number  Request made of family/significant other to:  Remove weapons (e.g., guns, rifles, knives), all items previously/currently identified as safety concern.    Remove drugs/medications (over-the-counter, prescriptions, illicit drugs), all items previously/currently identified as a safety concern.  The family member/significant other verbalizes understanding of the suicide prevention education information provided.  The family member/significant other agrees to remove the items of safety concern listed above.  Verdene LennertLauren C Varina Hulon 02/23/2016, 3:54 PM

## 2016-02-23 NOTE — Progress Notes (Signed)
[facility] MD Progress Note  02/23/2016 12:42 PM Russell Calhoun  MRN:  [id number] Subjective:  Patient reports he is starting to feel better. Describes gradually improving mood, decreased level of anxiety, and feeling somewhat more optimistic.  At this time he is not endorsing BZD withdrawal symptoms. Denies suicidal ideations . Sleep remains fair, but is significantly improved compared to admission, states he slept 4-5 hours last night. At this time denies medication side effects. Objective:  I have discussed case with treatment team and have met with patient . Presents partially but significantly improved compared to admission presentation. At this time visible in day room, less isolative and more interactive. This is significant, because patient states he has chronic social anxiety/phobia symptoms, but is now feeling more comfortable and less apprehensive in group settings. Tolerating BZD detox well, denies any BZD WDL symptoms at present , and does not present agitated, restless or tremulous. No disruptive or agitated behaviors on unit. Denies medication side effects.  Principal Problem: Major depressive disorder, recurrent severe without psychotic features ([place]) Diagnosis:   Patient Active Problem List   Diagnosis Date Noted  . Major depressive disorder, recurrent severe without psychotic features ([place]) [F33.2] 02/20/2016  . Generalized anxiety disorder [F41.1] 05/23/2014  . Erectile dysfunction [N52.9] 05/23/2014  . Insomnia [G47.00] 05/23/2014  . Gastroenteritis [K52.9] 05/23/2014   Total Time spent with patient: 20 minutes   Past Psychiatric History: see HPI  Past Medical History:  Past Medical History:  Diagnosis Date  . Anxiety   . Depression   . Substance abuse    History reviewed. No pertinent surgical history. Family History:  Family History  Problem Relation Age of Onset  . Mental illness Mother   . HIV/AIDS Father    Family Psychiatric  History: see HPI Social  History:  History  Alcohol Use  . Yes     History  Drug Use  . Types: Anabolic steroids    Comment: Cycles on before competition. But none recetnly.    Social History   Social History  . Marital status: Single    Spouse name: N/A  . Number of children: N/A  . Years of education: N/A   Social History Main Topics  . Smoking status: Never Smoker  . Smokeless tobacco: Never Used  . Alcohol use Yes  . Drug use: Yes    Types: Anabolic steroids     Comment: Cycles on before competition. But none recetnly.  . Sexual activity: Yes   Other Topics Concern  . None   Social History Narrative  . None   Additional Social History:   Sleep: Fair, but significantly improved   Appetite:  Improving   Current Medications: Current Facility-Administered Medications  Medication Dose Route Frequency Provider Last Rate Last Dose  . acetaminophen (TYLENOL) tablet 650 mg  650 mg Oral Q4H PRN Patrecia Pour, NP      . alum & mag hydroxide-simeth (MAALOX/MYLANTA) 200-200-20 MG/5ML suspension 30 mL  30 mL Oral PRN Patrecia Pour, NP      . hydrOXYzine (ATARAX/VISTARIL) tablet 25 mg  25 mg Oral Q6H PRN Jenne Campus, MD   25 mg at 02/22/16 2206  . ibuprofen (ADVIL,MOTRIN) tablet 600 mg  600 mg Oral Q8H PRN Patrecia Pour, NP      . loperamide (IMODIUM) capsule 2-4 mg  2-4 mg Oral PRN Jenne Campus, MD      . LORazepam (ATIVAN) tablet 1 mg  1 mg Oral Q6H PRN Myer Peer  Alexsandro Salek, MD      . LORazepam (ATIVAN) tablet 1 mg  1 mg Oral BID Jenne Campus, MD       Followed by  . [START ON 02/25/2016] LORazepam (ATIVAN) tablet 1 mg  1 mg Oral Daily Tala Eber A Natalin Bible, MD      . magnesium hydroxide (MILK OF MAGNESIA) suspension 30 mL  30 mL Oral Daily PRN Patrecia Pour, NP      . multivitamin with minerals tablet 1 tablet  1 tablet Oral Daily Jenne Campus, MD   1 tablet at 02/23/16 0836  . ondansetron (ZOFRAN-ODT) disintegrating tablet 4 mg  4 mg Oral Q6H PRN Jenne Campus, MD      .  QUEtiapine (SEROQUEL) tablet 100 mg  100 mg Oral QHS Kerrie Buffalo, NP   100 mg at 02/22/16 2205  . QUEtiapine (SEROQUEL) tablet 25 mg  25 mg Oral BID PRN Kerrie Buffalo, NP   25 mg at 02/22/16 1652  . thiamine (VITAMIN B-1) tablet 100 mg  100 mg Oral Daily Jenne Campus, MD   100 mg at 02/23/16 0836  . venlafaxine XR (EFFEXOR-XR) 24 hr capsule 150 mg  150 mg Oral Q breakfast Jenne Campus, MD   150 mg at 02/23/16 3220    Lab Results: No results found for this or any previous visit (from the past 48 hour(s)).  Blood Alcohol level:  Lab Results  Component Value Date   ETH <5 25/42/7062    Metabolic Disorder Labs: No results found for: HGBA1C, MPG No results found for: PROLACTIN No results found for: CHOL, TRIG, HDL, CHOLHDL, VLDL, LDLCALC  Physical Findings: AIMS: Facial and Oral Movements Muscles of Facial Expression: None, normal Lips and Perioral Area: None, normal Jaw: None, normal Tongue: None, normal,Extremity Movements Upper (arms, wrists, hands, fingers): None, normal Lower (legs, knees, ankles, toes): None, normal, Trunk Movements Neck, shoulders, hips: None, normal, Overall Severity Severity of abnormal movements (highest score from questions above): None, normal Incapacitation due to abnormal movements: None, normal Patient's awareness of abnormal movements (rate only patient's report): No Awareness, Dental Status Current problems with teeth and/or dentures?: No Does patient usually wear dentures?: No  CIWA:  CIWA-Ar Total: 1 COWS:     Musculoskeletal: Strength & Muscle Tone: within normal limits Gait & Station: normal Patient leans: N/A  Psychiatric Specialty Exam: Physical Exam  Nursing note and vitals reviewed.   ROS denies headache, denies chest pain, no shortness of breath, no vomiting, no fever, no chills  Blood pressure 109/76, pulse (!) 105, temperature 98.5 F (36.9 C), temperature source Oral, resp. rate 16, height '5\' 3"'  (1.6 m), weight 72.6  kg (160 lb).Body mass index is 28.34 kg/m.  General Appearance: improved eye contact   Eye Contact:  Good  Speech:  Normal Rate  Volume:  Normal   Mood:  Improving, less depressed, less anxious   Affect:  Less constricted, more reactive   Thought Process:  Linear  Orientation:  Full (Time, Place, and Person)  Thought Content:  Denies hallucinations, no delusions, not internally preoccupied   Suicidal Thoughts:  No denies any suicidal or self injurious ideations, denies any homicidal or violent ideations   Homicidal Thoughts:  No  Memory:  Recent and remote grossly intact   Judgement:  Fair  Insight:  Good  Psychomotor Activity:  Normal  Concentration:  Concentration: Good and Attention Span: Good  Recall:  Good  Fund of Knowledge:  Good  Language:  Good  Akathisia:  No  Handed:  Right  AIMS (if indicated):     Assets:  Resilience Social Support  ADL's:  Intact  Cognition:  WNL  Sleep:  Number of Hours: 5   Assessment - patient presents with partial but significant improvement compared to admission presentation. Mood is improving and anxiety has decreased. Denies any current suicidal ideations, and states he feels more optimistic, future oriented . Thus far tolerating BZD detox well, and is not currently presenting with symptoms of BZD withdrawal. Insomnia improving as well .  Treatment Plan Summary: Continue to encourage group and milieu participation to work on coping skills and symptom reduction Continue Ativan detox protocol to minimize risk of BZD withdrawal  Continue Effexor XR 150 mgrs QDAY for depression and anxiety  Continue Seroquel 100 mgrs QHS for mood and for insomnia ( patient has reported other sleeping medications have not worked as well- we have reviewed side effects, including risk of movement disorders, weight gain, metabolic disturbances )  Continue Seroquel 25 mgrs BID PRN for anxiety as needed  Treatment team working on disposition planning  Check Lipid  Panel, Prolactin, HgbA1C in AM Neita Garnet, MD  02/23/2016, 12:42 PM   Patient ID: Russell Calhoun, male   DOB: 07/02/1970, 46 y.o.   MRN: [id number]

## 2016-02-23 NOTE — Progress Notes (Signed)
Recreation Therapy Notes  Date: 02/23/16 Time: 0930 Location: [address] Group Room  Group Topic: Stress Management  Goal Area(s) Addresses:  Patient will verbalize importance of using healthy stress management.  Patient will identify positive emotions associated with healthy stress management.   Intervention: Stress Management  Activity :  Progressive Muscle Relaxation.  LRT introduced the stress management technique of progressive muscle relaxation.  LRT read script to allow patients to engage fully in the activity.  Patients were to follow along while LRT read script to participate in the technique.  Education:  Stress Management, Discharge Planning.   Education Outcome: Acknowledges edcuation/In group clarification offered/Needs additional education  Clinical Observations/Feedback: Pt did not attend group.   Zehra Rucci, LRT/CTRS         Mosiah Bastin A 02/23/2016 11:59 AM 

## 2016-02-23 NOTE — [facility] Group Notes (Signed)
[facility] LCSW Group Therapy 02/23/2016 1:15pm  Type of Therapy: Group Therapy- Feelings Around Relapse and Recovery  Participation Level: Active   Participation Quality:  Appropriate  Affect:  Appropriate  Cognitive: Alert and Oriented   Insight:  Developing   Engagement in Therapy: Developing/Improving and Engaged   Modes of Intervention: Clarification, Confrontation, Discussion, Education, Exploration, Limit-setting, Orientation, Problem-solving, Rapport Building, Dance movement psychotherapisteality Testing, Socialization and Support  Summary of Progress/Problems: The topic for today was feelings about relapse. The group discussed what relapse prevention is to them and identified triggers that they are on the path to relapse. Members also processed their feeling towards relapse and were able to relate to common experiences. Group also discussed coping skills that can be used for relapse prevention.  Pt participated in group discussion and described how he would like to be more honest with himself about his depression. Pt identified his children as a motivating factor for recovery and expressed feeling hopeful for the future.   Therapeutic Modalities:   Cognitive Behavioral Therapy Solution-Focused Therapy Assertiveness Training Relapse Prevention Therapy    Damien FusiLauren Vedika Dumlao, LCSW [id number] 02/23/2016 3:50 PM

## 2016-02-24 LAB — LIPID PANEL
CHOL/HDL RATIO: 3 ratio
Cholesterol: 145 mg/dL (ref 0–200)
HDL: 48 mg/dL (ref >40)
LDL Cholesterol: 81 mg/dL (ref 0–99)
TRIGLYCERIDES: 82 mg/dL (ref <150)
VLDL: 16 mg/dL (ref 0–40)

## 2016-02-24 NOTE — Progress Notes (Signed)
[place] [facility] MD Progress Note  02/24/2016 12:35 PM Russell Calhoun  MRN:  [id number] Subjective:  Im doing good much better. My sleep has improved went from 2 hours to 4 hours. My energy level has improved from, and Im thinking more positive. I realized one thing that the medication want work unless I do therapy. Ive developed a lot of loneliness due to my childhood drama.   Objective:  I have discussed case with nursing staff and have met with patient extensively. Presents partially but significantly improved compared to admission presentation. He reports that his level of depression and suicidal thoughts have decreased tremendously, as he now has a new understanding of his illness.  At this time visible in day room, less isolative and more interactive. He is observed smiling and working out at times. 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 He is attending groups and actively participating.   Tolerating BZD detox well, denies any BZD WDL symptoms at present , and does not present agitated, restless or tremulous. No disruptive or agitated behaviors on unit. Denies any SIHI/AVH at this time.  Denies medication side effects.  Principal Problem: Major depressive disorder, recurrent severe without psychotic features ([place]) Diagnosis:   Patient Active Problem List   Diagnosis Date Noted  . Major depressive disorder, recurrent severe without psychotic features ([place]) [F33.2] 02/20/2016  . Generalized anxiety disorder [F41.1] 05/23/2014  . Erectile dysfunction [N52.9] 05/23/2014  . Insomnia [G47.00] 05/23/2014  . Gastroenteritis [K52.9] 05/23/2014   Total Time spent with patient: 20 minutes   Past Psychiatric History: see HPI  Past Medical History:  Past Medical History:  Diagnosis Date  . Anxiety   . Depression   . Substance abuse    History reviewed. No pertinent surgical history. Family History:  Family History   Problem Relation Age of Onset  . Mental illness Mother   . HIV/AIDS Father    Family Psychiatric  History: see HPI Social History:  History  Alcohol Use  . Yes     History  Drug Use  . Types: Anabolic steroids    Comment: Cycles on before competition. But none recetnly.    Social History   Social History  . Marital status: Single    Spouse name: N/A  . Number of children: N/A  . Years of education: N/A   Social History Main Topics  . Smoking status: Never Smoker  . Smokeless tobacco: Never Used  . Alcohol use Yes  . Drug use: Yes    Types: Anabolic steroids     Comment: Cycles on before competition. But none recetnly.  . Sexual activity: Yes   Other Topics Concern  . None   Social History Narrative  . None   Additional Social History:   Sleep: Fair, but significantly improved   Appetite:  Improving   Current Medications: Current Facility-Administered Medications  Medication Dose Route Frequency Provider Last Rate Last Dose  . acetaminophen (TYLENOL) tablet 650 mg  650 mg Oral Q4H PRN Patrecia Pour, NP      . alum & mag hydroxide-simeth (MAALOX/MYLANTA) 200-200-20 MG/5ML suspension 30 mL  30 mL Oral PRN Patrecia Pour, NP      . ibuprofen (ADVIL,MOTRIN) tablet 600 mg  600 mg Oral Q8H PRN Patrecia Pour, NP      . Derrill Memo ON 02/25/2016] LORazepam (ATIVAN) tablet 1 mg  1 mg Oral Daily Jenne Campus, MD      .  magnesium hydroxide (MILK OF MAGNESIA) suspension 30 mL  30 mL Oral Daily PRN Patrecia Pour, NP      . multivitamin with minerals tablet 1 tablet  1 tablet Oral Daily Jenne Campus, MD   1 tablet at 02/24/16 [phone]  . QUEtiapine (SEROQUEL) tablet 100 mg  100 mg Oral QHS Kerrie Buffalo, NP   100 mg at 02/23/16 2157  . QUEtiapine (SEROQUEL) tablet 25 mg  25 mg Oral BID PRN Kerrie Buffalo, NP   25 mg at 02/23/16 2158  . thiamine (VITAMIN B-1) tablet 100 mg  100 mg Oral Daily Jenne Campus, MD   100 mg at 02/24/16 0820  . venlafaxine XR (EFFEXOR-XR) 24  hr capsule 150 mg  150 mg Oral Q breakfast Jenne Campus, MD   150 mg at 02/24/16 0820    Lab Results:  Results for orders placed or performed during the hospital encounter of 02/20/16 (from the past 48 hour(s))  Lipid panel     Status: None   Collection Time: 02/24/16  6:12 AM  Result Value Ref Range   Cholesterol 145 0 - 200 mg/dL   Triglycerides 82 <150 mg/dL   HDL 48 >40 mg/dL   Total CHOL/HDL Ratio 3.0 RATIO   VLDL 16 0 - 40 mg/dL   LDL Cholesterol 81 0 - 99 mg/dL    Comment:        Total Cholesterol/HDL:CHD Risk Coronary Heart Disease Risk Table                     Men   Women  1/2 Average Risk   3.4   3.3  Average Risk       5.0   4.4  2 X Average Risk   9.6   7.1  3 X Average Risk  23.4   11.0        Use the calculated Patient Ratio above and the CHD Risk Table to determine the patient's CHD Risk.        ATP III CLASSIFICATION (LDL):  <100     mg/dL   Optimal  100-129  mg/dL   Near or Above                    Optimal  130-159  mg/dL   Borderline  160-189  mg/dL   High  >190     mg/dL   Very High Performed at [facility] [address], [place], [place] [address]     Blood Alcohol level:  Lab Results  Component Value Date   ETH <5 40/10/2723    Metabolic Disorder Labs: No results found for: HGBA1C, MPG No results found for: PROLACTIN Lab Results  Component Value Date   CHOL 145 02/24/2016   TRIG 82 02/24/2016   HDL 48 02/24/2016   CHOLHDL 3.0 02/24/2016   VLDL 16 02/24/2016   LDLCALC 81 02/24/2016    Physical Findings: AIMS: Facial and Oral Movements Muscles of Facial Expression: None, normal Lips and Perioral Area: None, normal Jaw: None, normal Tongue: None, normal,Extremity Movements Upper (arms, wrists, hands, fingers): None, normal Lower (legs, knees, ankles, toes): None, normal, Trunk Movements Neck, shoulders, hips: None, normal, Overall Severity Severity of abnormal movements (highest score from questions above): None,  normal Incapacitation due to abnormal movements: None, normal Patient's awareness of abnormal movements (rate only patient's report): No Awareness, Dental Status Current problems with teeth and/or dentures?: No Does patient usually wear dentures?: No  CIWA:  CIWA-Ar Total: 1 COWS:     Musculoskeletal: Strength & Muscle Tone: within normal limits Gait & Station: normal Patient leans: N/A  Psychiatric Specialty Exam: Physical Exam  Nursing note and vitals reviewed.   ROS  denies headache, denies chest pain, no shortness of breath, no vomiting, no fever, no chills  Blood pressure 121/80, pulse 84, temperature 98.4 F (36.9 C), resp. rate 18, height '5\' 3"'  (1.6 m), weight 72.6 kg (160 lb).Body mass index is 28.34 kg/m.  General Appearance: improved eye contact   Eye Contact:  Good  Speech:  Normal Rate  Volume:  Normal   Mood:  Improving, less depressed, less anxious   Affect:  Less constricted, more reactive   Thought Process:  Linear  Orientation:  Full (Time, Place, and Person)  Thought Content:  Denies hallucinations, no delusions, not internally preoccupied   Suicidal Thoughts:  No denies any suicidal or self injurious ideations, denies any homicidal or violent ideations   Homicidal Thoughts:  No  Memory:  Recent and remote grossly intact   Judgement:  Fair  Insight:  Good  Psychomotor Activity:  Normal  Concentration:  Concentration: Good and Attention Span: Good  Recall:  Good  Fund of Knowledge:  Good  Language:  Good  Akathisia:  No  Handed:  Right  AIMS (if indicated):     Assets:  Resilience Social Support  ADL's:  Intact  Cognition:  WNL  Sleep:  Number of Hours: 5   Assessment - patient presents with partial but significant improvement compared to admission presentation. Mood is improving and anxiety has decreased. Denies any current suicidal ideations, and states he feels more optimistic, future oriented . Thus far tolerating BZD detox well, and is not  currently presenting with symptoms of BZD withdrawal. Insomnia improving as well .  Treatment Plan Summary: Continue to encourage group and milieu participation to work on coping skills and symptom reduction. Continue Ativan detox protocol to minimize risk of BZD withdrawal  Continue Effexor XR 150 mgrs QDAY for depression and anxiety  Continue Seroquel 100 mgrs QHS for mood and for insomnia ( patient has reported other sleeping medications have not worked as well- we have reviewed side effects, including risk of movement disorders, weight gain, metabolic disturbances )  Continue Seroquel 25 mgrs BID PRN for anxiety as needed  Treatment team working on disposition planning  Check Lipid Panel, Prolactin, HgbA1C in AM.  Nanci Pina, FNP  02/24/2016, 12:35 PM

## 2016-02-24 NOTE — Plan of Care (Signed)
Problem: Activity: Goal: Sleeping patterns will improve Outcome: Progressing Patient is resting in bed with his eyes closed and ear plugs in. Respirations are even and unlabored.  Problem: Safety: Goal: Periods of time without injury will increase Outcome: Progressing Patient remains safe on the unit at this time. Patient contracts for safety and is on q 15 min safety checks.

## 2016-02-24 NOTE — [facility] Group Notes (Signed)
Adult Therapy Group Note (Clinical Social Work)  Date:  02/24/2016  Time:  10:00-11:00AM  Group Topic/Focus: Fears and Healthy/Unhealthy Coping Skills  Building Self Esteem:   The Focus of this group was to discuss some of the prevalent fears that patients experience, and to identify the commonalities among group members.  An exercise was used to initiate the discussion, followed by writing on the white board a group-generated list of unhealthy coping and healthy coping techniques to deal with each fear.    Participation Level:  Active  Participation Quality:  Appropriate and Attentive  Affect:  Blunted  Cognitive:  Alert and Appropriate  Insight: Good  Engagement in Group:  Engaged  Modes of Intervention:  Discussion, Exploration and Support  Additional Comments:  The patient expressed that a healthy coping skill currently used is meditation, and he stated he is not sure what additional healthy coping techniques he wants to add.  Ambrose MantleMareida Grossman-Orr, LCSW 02/24/2016   12:35pm

## 2016-02-24 NOTE — Progress Notes (Signed)
Nursing Progress Note 7p-7a  D) Patient presents pleasant and cooperative. Patient attended group and was resting in his bed with his ear plugs. Patient states his day was "great" and denied SI/HI/AVH or pain. Patient contracts for safety at this time. Patient states he "feels like he's getting better". Patient complains of occasional racing thoughts and insomnia. Patient states "it helps to take my last daily PRN Seroquel with my nighttime Seroquel for me to sleep, it slows my thoughts".  A) Emotional support given. Patient medicated with PM orders as prescribed. Medications reviewed with patient. Patient on q15 min safety checks. Opportunities for questions or concerns presented to patient. Patient encouraged to continue to work on treatment goals.  R) Patient receptive to interaction with nurse. Patient remains safe on the unit at this time. Patient is resting in bed without complaints. Will continue to monitor.

## 2016-02-24 NOTE — [facility] Group Notes (Signed)
Goals group Date:  02/24/2016  Time:  11:34 AM  Type of Therapy:  Nurse Education  /  Goals Group:  The group is focused on teaching patients how to set attainable goals using the accronym [id number].  Participation Level:  Active  Participation Quality:  Attentive  Affect:  Appropriate  Cognitive:  Alert  Insight:  Appropriate  Engagement in Group:  Engaged  Modes of Intervention:  Education  Summary of Progress/Problems:  Russell Calhoun, Russell Calhoun 02/24/2016, 11:34 AM

## 2016-02-24 NOTE — Progress Notes (Signed)
D) Pt has attended the groups and interacts appropriately with his peers. Pt denies SI and HI. Pt rates his depression as a 5, hopelessness as a 5 and his anxiety as a 7. Has attended the groups and participates fully in them. Sits in the dayroom much of the shift. Pleasant. A) Given support, reassurance and praise along with encouragement. Provided with a brief 1:1 R) Denies SI and HI

## 2016-02-24 NOTE — Progress Notes (Signed)
[facility] Group Notes:  (Nursing/MHT/Case Management/Adjunct)  Date:  02/24/2016  Time:  11:37 PM  Type of Therapy:  Psychoeducational Skills  Participation Level:  Minimal  Participation Quality:  Attentive  Affect:  Flat  Cognitive:  Lacking  Insight:  Limited  Engagement in Group:  Limited  Modes of Intervention:  Education  Summary of Progress/Problems: Patient shared in group that he had a visit with his girlfriend this evening but offered no additional details about his day. As for the theme of the day, his coping skill will be to exercise more often.   Hazle CocaGOODMAN, Shaquitta Burbridge S 02/24/2016, 11:37 PM

## 2016-02-25 LAB — HEMOGLOBIN A1C
Hgb A1c MFr Bld: 5.2 % (ref 4.8–5.6)
Mean Plasma Glucose: 103 mg/dL

## 2016-02-25 MED ORDER — QUETIAPINE FUMARATE 50 MG PO TABS
150.0000 mg | ORAL_TABLET | Freq: Every day | ORAL | Status: DC
  Administered 2016-02-25: 22:00:00 150 mg via ORAL
  Filled 2016-02-25 (×4): qty 1

## 2016-02-25 NOTE — Progress Notes (Signed)
[facility] MD Progress Note  02/25/2016 12:56 PM Russell Calhoun  MRN:  [id number] Subjective:  Patient states that he is feeling better everyday.  Evident with pleasant disposition.   Objective:  I have discussed case with nursing staff and have met with patient extensively. He reports that his level of depression and suicidal thoughts have improved, as he now has a new understanding of his illness.  At this time visible in day room, less isolative and more interactive in the milieu.  He is observed smiling and working out at times.  He is attending groups and actively participating.   No disruptive or agitated behaviors on unit. Denies any SIHI/AVH at this time.  Denies medication side effects.  Principal Problem: Major depressive disorder, recurrent severe without psychotic features ([place]) Diagnosis:   Patient Active Problem List   Diagnosis Date Noted  . Major depressive disorder, recurrent severe without psychotic features ([place]) [F33.2] 02/20/2016  . Generalized anxiety disorder [F41.1] 05/23/2014  . Erectile dysfunction [N52.9] 05/23/2014  . Insomnia [G47.00] 05/23/2014  . Gastroenteritis [K52.9] 05/23/2014   Total Time spent with patient: 20 minutes   Past Psychiatric History: see HPI  Past Medical History:  Past Medical History:  Diagnosis Date  . Anxiety   . Depression   . Substance abuse    History reviewed. No pertinent surgical history. Family History:  Family History  Problem Relation Age of Onset  . Mental illness Mother   . HIV/AIDS Father    Family Psychiatric  History: see HPI Social History:  History  Alcohol Use  . Yes     History  Drug Use  . Types: Anabolic steroids    Comment: Cycles on before competition. But none recetnly.    Social History   Social History  . Marital status: Single    Spouse name: N/A  . Number of children: N/A  . Years of education: N/A   Social History Main Topics  . Smoking status: Never Smoker  . Smokeless tobacco: Never  Used  . Alcohol use Yes  . Drug use: Yes    Types: Anabolic steroids     Comment: Cycles on before competition. But none recetnly.  . Sexual activity: Yes   Other Topics Concern  . None   Social History Narrative  . None   Additional Social History:   Sleep: Fair, but significantly improved   Appetite:  Improving   Current Medications: Current Facility-Administered Medications  Medication Dose Route Frequency Provider Last Rate Last Dose  . acetaminophen (TYLENOL) tablet 650 mg  650 mg Oral Q4H PRN Patrecia Pour, NP      . alum & mag hydroxide-simeth (MAALOX/MYLANTA) 200-200-20 MG/5ML suspension 30 mL  30 mL Oral PRN Patrecia Pour, NP      . ibuprofen (ADVIL,MOTRIN) tablet 600 mg  600 mg Oral Q8H PRN Patrecia Pour, NP      . magnesium hydroxide (MILK OF MAGNESIA) suspension 30 mL  30 mL Oral Daily PRN Patrecia Pour, NP      . multivitamin with minerals tablet 1 tablet  1 tablet Oral Daily Jenne Campus, MD   1 tablet at 02/25/16 0752  . QUEtiapine (SEROQUEL) tablet 100 mg  100 mg Oral QHS Kerrie Buffalo, NP   100 mg at 02/24/16 2234  . QUEtiapine (SEROQUEL) tablet 25 mg  25 mg Oral BID PRN Kerrie Buffalo, NP   25 mg at 02/24/16 2235  . thiamine (VITAMIN B-1) tablet 100 mg  100 mg  Oral Daily Jenne Campus, MD   100 mg at 02/25/16 0752  . venlafaxine XR (EFFEXOR-XR) 24 hr capsule 150 mg  150 mg Oral Q breakfast Jenne Campus, MD   150 mg at 02/25/16 3790    Lab Results:  Results for orders placed or performed during the hospital encounter of 02/20/16 (from the past 48 hour(s))  Lipid panel     Status: None   Collection Time: 02/24/16  6:12 AM  Result Value Ref Range   Cholesterol 145 0 - 200 mg/dL   Triglycerides 82 <150 mg/dL   HDL 48 >40 mg/dL   Total CHOL/HDL Ratio 3.0 RATIO   VLDL 16 0 - 40 mg/dL   LDL Cholesterol 81 0 - 99 mg/dL    Comment:        Total Cholesterol/HDL:CHD Risk Coronary Heart Disease Risk Table                     Men   Women  1/2  Average Risk   3.4   3.3  Average Risk       5.0   4.4  2 X Average Risk   9.6   7.1  3 X Average Risk  23.4   11.0        Use the calculated Patient Ratio above and the CHD Risk Table to determine the patient's CHD Risk.        ATP III CLASSIFICATION (LDL):  <100     mg/dL   Optimal  100-129  mg/dL   Near or Above                    Optimal  130-159  mg/dL   Borderline  160-189  mg/dL   High  >190     mg/dL   Very High Performed at [facility] [address] [place], [place] [address]   Hemoglobin A1c     Status: None   Collection Time: 02/24/16  6:12 AM  Result Value Ref Range   Hgb A1c MFr Bld 5.2 4.8 - 5.6 %    Comment: (NOTE)         Pre-diabetes: 5.7 - 6.4         Diabetes: >6.4         Glycemic control for adults with diabetes: <7.0    Mean Plasma Glucose 103 mg/dL    Comment: (NOTE) Performed At: [facility] [place], [place] [id number] Lindon Romp MD AS:[id number] Performed at [facility], [place] [address], [place], [place] [address]     Blood Alcohol level:  Lab Results  Component Value Date   ETH <5 19/41/7408    Metabolic Disorder Labs: Lab Results  Component Value Date   HGBA1C 5.2 02/24/2016   MPG 103 02/24/2016   No results found for: PROLACTIN Lab Results  Component Value Date   CHOL 145 02/24/2016   TRIG 82 02/24/2016   HDL 48 02/24/2016   CHOLHDL 3.0 02/24/2016   VLDL 16 02/24/2016   LDLCALC 81 02/24/2016    Physical Findings: AIMS: Facial and Oral Movements Muscles of Facial Expression: None, normal Lips and Perioral Area: None, normal Jaw: None, normal Tongue: None, normal,Extremity Movements Upper (arms, wrists, hands, fingers): None, normal Lower (legs, knees, ankles, toes): None, normal, Trunk Movements Neck, shoulders, hips: None, normal, Overall Severity Severity of abnormal movements (highest score from questions above): None, normal Incapacitation due to abnormal  movements:  None, normal Patient's awareness of abnormal movements (rate only patient's report): No Awareness, Dental Status Current problems with teeth and/or dentures?: No Does patient usually wear dentures?: No  CIWA:  CIWA-Ar Total: 1 COWS:     Musculoskeletal: Strength & Muscle Tone: within normal limits Gait & Station: normal Patient leans: N/A  Psychiatric Specialty Exam: Physical Exam  Nursing note and vitals reviewed.   ROS denies headache, denies chest pain, no shortness of breath, no vomiting, no fever, no chills  Blood pressure 111/71, pulse 88, temperature 97.8 F (36.6 C), temperature source Oral, resp. rate 16, height _0  (1.6 m), weight 72.6 kg (160 lb).Body mass index is 28.34 kg/m.  General Appearance: improved eye contact   Eye Contact:  Good  Speech:  Normal Rate  Volume:  Normal   Mood:  Improving, less depressed, less anxious   Affect:  Less constricted, more reactive   Thought Process:  Linear  Orientation:  Full (Time, Place, and Person)  Thought Content:  Denies hallucinations, no delusions, not internally preoccupied   Suicidal Thoughts:  No denies any suicidal or self injurious ideations, denies any homicidal or violent ideations   Homicidal Thoughts:  No  Memory:  Recent and remote grossly intact   Judgement:  Fair  Insight:  Good  Psychomotor Activity:  Normal  Concentration:  Concentration: Good and Attention Span: Good  Recall:  Good  Fund of Knowledge:  Good  Language:  Good  Akathisia:  No  Handed:  Right  AIMS (if indicated):     Assets:  Resilience Social Support  ADL's:  Intact  Cognition:  WNL  Sleep:  Number of Hours: 6   Assessment - patient presents with partial but significant improvement compared to admission presentation. Mood is improving and anxiety has decreased. Denies any current suicidal ideations, and states he feels more optimistic, future oriented . Thus far tolerating BZD detox well, and is not currently presenting  with symptoms of BZD withdrawal. Insomnia improving as well .  Treatment Plan Summary: Continue to encourage group and milieu participation to work on coping skills and symptom reduction. Continue Ativan detox protocol to minimize risk of BZD withdrawal  Continue Effexor XR 150 mgrs QDAY for depression and anxiety  Continue Seroquel 100 mgrs QHS for mood and for insomnia ( patient has reported other sleeping medications have not worked as well- we have reviewed side effects, including risk of movement disorders, weight gain, metabolic disturbances )  Continue Seroquel 25 mgrs BID PRN for anxiety as needed  Treatment team working on disposition planning  Check Lipid Panel, Prolactin, HgbA1C in [place], NP [facility] 02/25/2016, 12:56 PM

## 2016-02-25 NOTE — [facility] Group Notes (Signed)
Healthy Support Systems   Date:  02/25/2016  Time: 0930   Type of Therapy:  Nurse Education   /  Healthy SUpport Systems :  The group focuses on teaching patients how to develop and utilize  healthy support systems in order to maintain homeostasis in their lives. Participation Level:  Minimal  Participation Quality:  Appropriate  Affect:  Appropriate  Cognitive:  Alert  Insight:  Improving  Engagement in Group:  Engaged  Modes of Intervention:  Education  Summary of Progress/Problems:  Rich BraveDuke, Laparis Durrett Lynn 02/25/2016, 1:08 PM

## 2016-02-25 NOTE — Progress Notes (Signed)
[facility] Group Notes:  (Nursing/MHT/Case Management/Adjunct)  Date:  02/25/2016  Time:  9:49 PM  Type of Therapy:  Psychoeducational Skills  Participation Level:  Active  Participation Quality:  Attentive  Affect:  Blunted  Cognitive:  Appropriate  Insight:  Improving  Engagement in Group:  Developing/Improving  Modes of Intervention:  Education  Summary of Progress/Problems: The patient mentioned in group that he had a good day and that he was able to have a good conversation with his brother. He states [place] from the hospital. In terms of the theme for the day, his support system will consist of his girlfriend and brother.   Hazle CocaGOODMAN, Russell Calhoun 02/25/2016, 9:49 PM

## 2016-02-25 NOTE — Progress Notes (Signed)
D: When asked about his day pt stated,"I'm supposed to get 175 mg of seroquel. Stated the PA "wrote it down on the pad so she could tell the Dr".  Pt has no questions or concerns.    A: Pt was given 25mg  prn dose of seroquel as requested.  Support and encouragement was offered. 15 min checks continued for safety.  R: Pt remains safe.

## 2016-02-25 NOTE — Progress Notes (Signed)
D) Pt has been attending the groups and interacting with his peers. Pt having trouble sleeping at night. States he has had trouble sleeping for a long time. Rates his depression at a 6, hopelessness at a 6 and his anxiety at a 6. Denies SI and HI.  A) Pt provided with a 1:1. Encouragement and praise given. Encouraged Pt to talk with the NP so meds would be increased at bedtime.  R) Denies SI and HI. States, "I just want to be able to sleep".

## 2016-02-25 NOTE — [facility] Group Notes (Signed)
[facility] Group Notes:  (Clinical Social Work)   02/25/2016    10:00-11:00AM  Summary of Progress/Problems:   The main focus of today's process group was to   1)  discuss the importance of adding supports  2)  define healthy supports versus unhealthy supports  3)  identify the patient's current unhealthy supports and plan how to handle them  4)  Identify the patient's current healthy supports and plan what to add.  An emphasis was placed on using counselor, doctor, therapy groups, 12-step groups, and problem-specific support groups to expand supports.    The patient expressed full comprehension of the concepts presented, and agreed that there is a need to add more supports.  The patient stated his girlfriend and brother are healthy supports, as is his 4yo daughter.  He himself is the most unhealthy support.  He spoke little in group but listened intently.  Type of Therapy:  Process Group with Motivational Interviewing  Participation Level:  Active  Participation Quality:  Attentive  Affect:  Appropriate  Cognitive:  Appropriate  Insight:  Engaged  Engagement in Therapy:  Engaged  Modes of Intervention:   Education, Support and Processing  Ambrose MantleMareida Grossman-Orr, LCSW 02/25/2016    1:18 PM

## 2016-02-26 LAB — PROLACTIN: PROLACTIN: 25.1 ng/mL — AB (ref 4.0–15.2)

## 2016-02-26 MED ORDER — QUETIAPINE FUMARATE 50 MG PO TABS: 150.0000 mg | ORAL_TABLET | Freq: Every day | ORAL | 0 refills | Status: DC

## 2016-02-26 MED ORDER — ACETAMINOPHEN 500 MG PO TABS: 500.0000 mg | ORAL_TABLET | Freq: Four times a day (QID) | ORAL | 0 refills | Status: DC | PRN

## 2016-02-26 MED ORDER — QUETIAPINE FUMARATE 25 MG PO TABS: 25.0000 mg | ORAL_TABLET | Freq: Two times a day (BID) | ORAL | 0 refills | Status: DC | PRN

## 2016-02-26 MED ORDER — QUETIAPINE FUMARATE 50 MG PO TABS
150.0000 mg | ORAL_TABLET | Freq: Every day | ORAL | Status: DC
  Filled 2016-02-26: qty 21

## 2016-02-26 MED ORDER — VENLAFAXINE HCL ER 150 MG PO CP24: 150.0000 mg | ORAL_CAPSULE | Freq: Every day | ORAL | 0 refills | Status: DC

## 2016-02-26 NOTE — Progress Notes (Signed)
Recreation Therapy Notes  Date: 02/26/16 Time: 0930 Location: 300 Hall Group Room  Group Topic: Stress Management  Goal Area(s) Addresses:  Patient will verbalize importance of using healthy stress management.  Patient will identify positive emotions associated with healthy stress management.   Intervention: Stress Management  Activity :  Daily Calm.  LRT introduced the stress management technique of meditation to the group.  LRT played a meditation from the Calm App to allow patients the opportunity to engage in the meditation.  Patients were to follow along with the meditation to fully engage in the technique.  Education:  Stress Management, Discharge Planning.   Education Outcome: Acknowledges edcuation/In group clarification offered/Needs additional education  Clinical Observations/Feedback: Pt did not attend group.    Russell RancherMarjette Kawon Calhoun, LRT/CTRS         Russell RancherLindsay, Angellee Cohill A 02/26/2016 12:21 PM

## 2016-02-26 NOTE — Tx Team (Signed)
Interdisciplinary Treatment and Diagnostic Plan Update  02/26/2016 Time of Session: 10:56 AM  Russell Calhoun MRN: [id number]  Principal Diagnosis: Major depressive disorder, recurrent severe without psychotic features (HCC)  Secondary Diagnoses: Principal Problem:   Major depressive disorder, recurrent severe without psychotic features (HCC)   Current Medications:  Current Facility-Administered Medications  Medication Dose Route Frequency Provider Last Rate Last Dose  . acetaminophen (TYLENOL) tablet 650 mg  650 mg Oral Q4H PRN Charm RingsJamison Y Lord, NP      . alum & mag hydroxide-simeth (MAALOX/MYLANTA) 200-200-20 MG/5ML suspension 30 mL  30 mL Oral PRN Charm RingsJamison Y Lord, NP      . ibuprofen (ADVIL,MOTRIN) tablet 600 mg  600 mg Oral Q8H PRN Charm RingsJamison Y Lord, NP      . magnesium hydroxide (MILK OF MAGNESIA) suspension 30 mL  30 mL Oral Daily PRN Charm RingsJamison Y Lord, NP      . multivitamin with minerals tablet 1 tablet  1 tablet Oral Daily Craige CottaFernando A Cobos, MD   1 tablet at 02/26/16 0815  . QUEtiapine (SEROQUEL) tablet 150 mg  150 mg Oral QHS Adonis BrookSheila Agustin, NP   150 mg at 02/25/16 2226  . QUEtiapine (SEROQUEL) tablet 25 mg  25 mg Oral BID PRN Adonis BrookSheila Agustin, NP   25 mg at 02/26/16 [id number]  . thiamine (VITAMIN B-1) tablet 100 mg  100 mg Oral Daily Craige CottaFernando A Cobos, MD   100 mg at 02/26/16 0815  . venlafaxine XR (EFFEXOR-XR) 24 hr capsule 150 mg  150 mg Oral Q breakfast Craige CottaFernando A Cobos, MD   150 mg at 02/26/16 0815    PTA Medications: Prescriptions Prior to Admission  Medication Sig Dispense Refill Last Dose  . Ascorbic Acid (VITAMIN C) 1000 MG tablet Take 2,000 mg by mouth daily.   Past Week at Unknown time  . busPIRone (BUSPAR) 7.5 MG tablet 1 tab po bid (Patient not taking: Reported on 02/20/2016) 60 tablet 0 Not Taking at Unknown time  . divalproex (DEPAKOTE) 250 MG DR tablet Take 250 mg by mouth 2 (two) times daily.   Past Week at Unknown time  . escitalopram (LEXAPRO) 20 MG tablet Take 20 mg by mouth  daily.   Past Week at Unknown time  . mirtazapine (REMERON) 30 MG tablet Take 1 tablet by mouth at bedtime.  1 Past Week at Unknown time  . sildenafil (VIAGRA) 100 MG tablet Take 1 tablet (100 mg total) by mouth daily as needed for erectile dysfunction. 8 tablet 3 unknown  . traZODone (DESYREL) 100 MG tablet Take 1 tablet (100 mg total) by mouth at bedtime as needed for sleep. 30 tablet 0 Past Week at Unknown time  . venlafaxine XR (EFFEXOR XR) 75 MG 24 hr capsule Take 1 capsule (75 mg total) by mouth daily with breakfast. 30 capsule 0 Past Week at Unknown time  . [DISCONTINUED] acetaminophen (TYLENOL) 500 MG tablet Take 500 mg by mouth every 6 (six) hours as needed. For headache.   Past Month at Unknown time    Treatment Modalities: Medication Management, Group therapy, Case management,  1 to 1 session with clinician, Psychoeducation, Recreational therapy.  Patient Stressors: Financial difficulties Occupational concerns  Patient Strengths: Ability for Warden/rangerinsight Communication skills Motivation for treatment/growth Supportive family/friends  Physician Treatment Plan for Primary Diagnosis: Major depressive disorder, recurrent severe without psychotic features (HCC) Long Term Goal(s): Improvement in symptoms so as ready for discharge  Short Term Goals: Ability to disclose and discuss suicidal ideas Ability to demonstrate self-control  will improve Ability to identify and develop effective coping behaviors will improve Ability to maintain clinical measurements within normal limits will improve Compliance with prescribed medications will improve Ability to identify triggers associated with substance abuse/mental health issues will improve  Medication Management: Evaluate patient's response, side effects, and tolerance of medication regimen.  Therapeutic Interventions: 1 to 1 sessions, Unit Group sessions and Medication administration.  Evaluation of Outcomes: Adequate for  Discharge  Physician Treatment Plan for Secondary Diagnosis: Principal Problem:   Major depressive disorder, recurrent severe without psychotic features (HCC)   Long Term Goal(s): Improvement in symptoms so as ready for discharge  Short Term Goals: Ability to disclose and discuss suicidal ideas Ability to demonstrate self-control will improve Ability to identify and develop effective coping behaviors will improve Ability to maintain clinical measurements within normal limits will improve Compliance with prescribed medications will improve Ability to identify triggers associated with substance abuse/mental health issues will improve  Medication Management: Evaluate patient's response, side effects, and tolerance of medication regimen.  Therapeutic Interventions: 1 to 1 sessions, Unit Group sessions and Medication administration.  Evaluation of Outcomes: Adequate for Discharge   RN Treatment Plan for Primary Diagnosis: Major depressive disorder, recurrent severe without psychotic features (HCC) Long Term Goal(s): Knowledge of disease and therapeutic regimen to maintain health will improve  Short Term Goals: Ability to verbalize feelings will improve, Ability to disclose and discuss suicidal ideas and Ability to identify and develop effective coping behaviors will improve  Medication Management: RN will administer medications as ordered by provider, will assess and evaluate patient's response and provide education to patient for prescribed medication. RN will report any adverse and/or side effects to prescribing provider.  Therapeutic Interventions: 1 on 1 counseling sessions, Psychoeducation, Medication administration, Evaluate responses to treatment, Monitor vital signs and CBGs as ordered, Perform/monitor CIWA, COWS, AIMS and Fall Risk screenings as ordered, Perform wound care treatments as ordered.  Evaluation of Outcomes: Adequate for Discharge   LCSW Treatment Plan for Primary  Diagnosis: Major depressive disorder, recurrent severe without psychotic features (HCC) Long Term Goal(s): Safe transition to appropriate next level of care at discharge, Engage patient in therapeutic group addressing interpersonal concerns.  Short Term Goals: Engage patient in aftercare planning with referrals and resources, Identify triggers associated with mental health/substance abuse issues and Increase skills for wellness and recovery  Therapeutic Interventions: Assess for all discharge needs, 1 to 1 time with Social worker, Explore available resources and support systems, Assess for adequacy in community support network, Educate family and significant other(s) on suicide prevention, Complete Psychosocial Assessment, Interpersonal group therapy.  Evaluation of Outcomes: Adequate for Discharge   Progress in Treatment: Attending groups: Pt is new to milieu, continuing to assess  Participating in groups: Pt is new to milieu, continuing to assess  Taking medication as prescribed: Yes, MD continues to assess for medication changes as needed Toleration medication: Yes, no side effects reported at this time Family/Significant other contact made: No, CSW assessing for appropriate contact Patient understands diagnosis: Continuing to assess Discussing patient identified problems/goals with staff: Yes Medical problems stabilized or resolved: Yes Denies suicidal/homicidal ideation: No, recently admitted with SI Issues/concerns per patient self-inventory: None Other: N/A  New problem(s) identified: None identified at this time.   New Short Term/Long Term Goal(s): None identified at this time.   Discharge Plan or Barriers: CSW will assess for appropriate discharge plan and relevant barriers.   Reason for Continuation of Hospitalization: Anxiety Depression Medication stabilization Suicidal ideation Withdrawal symptoms  Estimated Length of Stay: 1/29  Attendees: Patient: 02/26/2016  10:56  AM  Physician: Dr. Jama Flavors 02/26/2016  10:56 AM  Nursing: Liborio Nixon, RN; Joslyn Devon, RN 02/26/2016  10:56 AM  RN Care Manager: Onnie Boer, RN 02/26/2016  10:56 AM  Social Worker:Latravion Graves L Rome, LCSWA 02/26/2016  10:56 AM  Recreational Therapist:  02/26/2016  10:56 AM  Other: Armandina Stammer, NP; Gray Bernhardt, NP 02/26/2016  10:56 AM  Other:  02/26/2016  10:56 AM  Other: 02/26/2016  10:56 AM    Scribe for Treatment Team: Rondall Allegra, LCSWA 02/26/2016 10:56 AM

## 2016-02-26 NOTE — Discharge Summary (Signed)
Physician Discharge Summary Note  Patient:  Russell Calhoun is an 46 y.o., male MRN:  [id number] DOB:  November 11, 1970 Patient phone:  [phone] (home)  Patient address:   [address] [place] [address],  Total Time spent with patient: Greater than 30 minutes  Date of Admission:  02/20/2016  Date of Discharge: 02-26-16  Reason for Admission: Worsening symptoms of depression triggering suicidal ideations & an attempt by overdose.  Principal Problem: Major depressive disorder, recurrent severe without psychotic features [facility])  Discharge Diagnoses: Patient Active Problem List   Diagnosis Date Noted  . Major depressive disorder, recurrent severe without psychotic features (HCC) [F33.2] 02/20/2016  . Generalized anxiety disorder [F41.1] 05/23/2014  . Erectile dysfunction [N52.9] 05/23/2014  . Insomnia [G47.00] 05/23/2014  . Gastroenteritis [K52.9] 05/23/2014   Past Psychiatric History: Major depression, Generalized anxiety disorder.  Past Medical History:  Past Medical History:  Diagnosis Date  . Anxiety   . Depression   . Substance abuse    History reviewed. No pertinent surgical history.  Family History:  Family History  Problem Relation Age of Onset  . Mental illness Mother   . HIV/AIDS Father    Family Psychiatric  History: See H&P  Social History:  History  Alcohol Use  . Yes     History  Drug Use  . Types: Anabolic steroids    Comment: Cycles on before competition. But none recetnly.    Social History   Social History  . Marital status: Single    Spouse name: N/A  . Number of children: N/A  . Years of education: N/A   Social History Main Topics  . Smoking status: Never Smoker  . Smokeless tobacco: Never Used  . Alcohol use Yes  . Drug use: Yes    Types: Anabolic steroids     Comment: Cycles on before competition. But none recetnly.  . Sexual activity: Yes   Other Topics Concern  . None   Social History Narrative  . None   Hospital  Course: Russell Calhoun states he has been struggling with depression and anxiety for months, but that these have been worsening recently. He states he has been having increasingly frequent suicidal ideations over recent weeks. He states he had checked into a hotel with thoughts of suicide, jumping off a balcony. He overdosed on about 10-15 xanax tablets ( 2 mgr ?) , which he states he procured on street. Patient states his memory of events after overdose is limited, but he thinks he called his GF who then brought him to hospital. This occurred on 1/22.  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   While a patient in this [facility] adult unit, Russell Calhoun was placed on medication regimen for his presenting symptoms after thorough admission evaluation. He received Ativan detoxification treatment protocols to combat substance withdrawal symptoms. His UDS on admission was positive for Benzodiazepine & THC. He was encouraged to confront the fact that he does have substance abuse issues and should deal with the problem appropriately by receiving some substance abuse treatment.   Besides the Ativan detox protocols, Russell Calhoun was medicated & discharged on; Effexor XR 150 mg for depression & Seroquel 25 mg & 150 mg respectively for agitation/mood control. He was enrolled & participated in the scheduled group counseling sessions & activities being offered & held on this unit. He participated & learned coping skills. Russell Calhoun presented  on daily basis an improved mood & decreased symptoms of depression.   He is currently being discharged to his home with family as he presents mentally & medically stable. He is to follow-up care on outpatient basis as noted below. [name] is provided with all the pertinent information needed to make this appointment without problems. Upon discharge, he denies any SIHI, AVH, delusional thoughts, paranoia & or  substance withdrawal symptoms. He was discharged with all personal belongings in no apparent distress. Transportation per significant other.  Physical Findings: AIMS: Facial and Oral Movements Muscles of Facial Expression: None, normal Lips and Perioral Area: None, normal Jaw: None, normal Tongue: None, normal,Extremity Movements Upper (arms, wrists, hands, fingers): None, normal Lower (legs, knees, ankles, toes): None, normal, Trunk Movements Neck, shoulders, hips: None, normal, Overall Severity Severity of abnormal movements (highest score from questions above): None, normal Incapacitation due to abnormal movements: None, normal Patient's awareness of abnormal movements (rate only patient's report): No Awareness, Dental Status Current problems with teeth and/or dentures?: No Does patient usually wear dentures?: No  CIWA:  CIWA-Ar Total: 1 COWS:     Musculoskeletal: Strength & Muscle Tone: within normal limits Gait & Station: normal Patient leans: N/A  Psychiatric Specialty Exam: Physical Exam  Constitutional: He is oriented to person, place, and time. He appears well-developed.  HENT:  Head: Normocephalic.  Eyes: Pupils are equal, round, and reactive to light.  Neck: Normal range of motion.  Cardiovascular: Normal rate.   Respiratory: Effort normal.  GI: Soft.  Genitourinary:  Genitourinary Comments: Deferred  Musculoskeletal: Normal range of motion.  Neurological: He is alert and oriented to person, place, and time.  Skin: Skin is warm.    Review of Systems  Constitutional: Negative.   HENT: Negative.   Eyes: Negative.   Respiratory: Negative.   Cardiovascular: Negative.   Gastrointestinal: Negative.   Genitourinary: Negative.   Musculoskeletal: Negative.   Skin: Negative.   Neurological: Negative.   Endo/Heme/Allergies: Negative.   Psychiatric/Behavioral: Positive for depression (Stable ) and substance abuse (Hx. Benzodiazepine/THC abuse). Negative for  hallucinations, memory loss and suicidal ideas. The patient has insomnia (Stable). The patient is not nervous/anxious.     Blood pressure 101/76, pulse 93, temperature 98.5 F (36.9 C), temperature source Oral, resp. rate 16, height 5\' 3"  (1.6 m), weight 72.6 kg (160 lb).Body mass index is 28.34 kg/m.  See Md's SRA   Have you used any form of tobacco in the last 30 days? (Cigarettes, Smokeless Tobacco, Cigars, and/or Pipes): No  Has this patient used any form of tobacco in the last 30 days? (Cigarettes, Smokeless Tobacco, Cigars, and/or Pipes)No  Blood Alcohol level:  Lab Results  Component Value Date   ETH <5 02/19/2016   Metabolic Disorder Labs:  Lab Results  Component Value Date   HGBA1C 5.2 02/24/2016   MPG 103 02/24/2016   Lab Results  Component Value Date   PROLACTIN 25.1 (H) 02/24/2016   Lab Results  Component Value Date   CHOL 145 02/24/2016   TRIG 82 02/24/2016   HDL 48 02/24/2016   CHOLHDL 3.0 02/24/2016   VLDL 16 02/24/2016   LDLCALC 81 02/24/2016   See Psychiatric Specialty Exam and Suicide Risk Assessment completed by Attending Physician prior to discharge.  Discharge destination:  Home  Is patient on multiple antipsychotic therapies at discharge:  No   Has Patient had three or more failed trials of antipsychotic monotherapy by history:  No  Recommended Plan for Multiple Antipsychotic Therapies: NA  Allergies  as of 02/26/2016   No Known Allergies     Medication List    STOP taking these medications   busPIRone 7.5 MG tablet Commonly known as:  BUSPAR   divalproex 250 MG DR tablet Commonly known as:  DEPAKOTE   escitalopram 20 MG tablet Commonly known as:  LEXAPRO   mirtazapine 30 MG tablet Commonly known as:  REMERON   sildenafil 100 MG tablet Commonly known as:  VIAGRA   traZODone 100 MG tablet Commonly known as:  DESYREL   vitamin C 1000 MG tablet     TAKE these medications     Indication  acetaminophen 500 MG tablet Commonly  known as:  TYLENOL Take 1 tablet (500 mg total) by mouth every 6 (six) hours as needed. For headache.  Indication:  Fever, Pain   QUEtiapine 50 MG tablet Commonly known as:  SEROQUEL Take 3 tablets (150 mg total) by mouth at bedtime. For mood control  Indication:  Mood control   QUEtiapine 25 MG tablet Commonly known as:  SEROQUEL Take 1 tablet (25 mg total) by mouth 2 (two) times daily as needed (racing thoughts).  Indication:  Racing thoughts   venlafaxine XR 150 MG 24 hr capsule Commonly known as:  EFFEXOR-XR Take 1 capsule (150 mg total) by mouth daily with breakfast. For depression What changed:  medication strength  how much to take  additional instructions  Indication:  Major Depressive Disorder      Follow-up Information    [facility] Follow up.   Specialty:  Behavioral Health Why:  Please go within 1-3 days of discharge to be assessed for the intensive outpatient program and medication management. Walk in hours are from 12:30pm-2:45pm M, W, and F Contact information: [address] [place] [address] [phone]          Follow-up recommendations: Activity:  As tolerated Diet: As recommended by your primary care doctor. Keep all scheduled follow-up appointments as recommended.  Comments: Patient is instructed prior to discharge to: Take all medications as prescribed by his/her mental healthcare provider. Report any adverse effects and or reactions from the medicines to his/her outpatient provider promptly. Patient has been instructed & cautioned: To not engage in alcohol and or illegal drug use while on prescription medicines. In the event of worsening symptoms, patient is instructed to call the crisis hotline, 911 and or go to the nearest ED for appropriate evaluation and treatment of symptoms. To follow-up with his/her primary care provider for your other medical issues, concerns and or health care needs.   Signed: Sanjuana Kava, NP, PMHNP, FNP-BC 02/27/2016, 11:32 AM   Patient seen, Suicide Assessment Completed.  Disposition Plan Reviewed

## 2016-02-26 NOTE — Progress Notes (Signed)
Discharge note:  Patient discharged home per MD order.  Patient is to follow up with [facility] upon discharge.  He denies any thoughts of self harm.  Patient received prescriptions and samples of his medications.  Reviewed AVS/transition record with patient and he indicated understanding.  Patient left ambulatory with his girlfriend.  Patient did not have any belongings in a locker; he received all belongings from his room.

## 2016-02-26 NOTE — BHH Suicide Risk Assessment (Addendum)
[facility]   Principal Problem: Major depressive disorder, recurrent severe without psychotic features [facility](HCC) Discharge Diagnoses:  Patient Active Problem List   Diagnosis Date Noted  . Major depressive disorder, recurrent severe without psychotic features (HCC) [F33.2] 02/20/2016  . Generalized anxiety disorder [F41.1] 05/23/2014  . Erectile dysfunction [N52.9] 05/23/2014  . Insomnia [G47.00] 05/23/2014  . Gastroenteritis [K52.9] 05/23/2014    Total Time spent with patient: 30 minutes  Musculoskeletal: Strength & Muscle Tone: within normal limits Gait & Station: normal Patient leans: N/A  Psychiatric Specialty Exam: ROS denies headache, no shortness of breath, no chest pain, no vomiting   Blood pressure 101/76, pulse 93, temperature 98.5 F (36.9 C), temperature source Oral, resp. rate 16, height 5\' 3"  (1.6 m), weight 72.6 kg (160 lb).Body mass index is 28.34 kg/m.  General Appearance: Well Groomed  Eye Contact::  Good  Speech:  Normal Rate409  Volume:  Normal  Mood:  improved mood, states " I am feeling much better"  Affect:  Appropriate and fuller in range   Thought Process:  Linear  Orientation:  Full (Time, Place, and Person)  Thought Content:  no hallucinations, no delusions , not internally preoccupied   Suicidal Thoughts:  No denies any suicidal or self injurious ideations   Homicidal Thoughts:  No denies any homicidal or violent or homicidal ideations   Memory:  recent and remote grossly intact   Judgement:  Other:  improved  Insight:  improved   Psychomotor Activity:  Normal  Concentration:  Good  Recall:  Good  Fund of Knowledge:Good  Language: Good  Akathisia:  Negative  Handed:  Right  AIMS (if indicated):   no abnormal or involuntary movements noted or reported  Assets:  Communication Skills Desire for Improvement Resilience  Sleep:  Number of Hours: 6.25  Cognition: WNL  ADL's:  Intact   Mental Status Per Nursing  Assessment::   On Admission:  Suicidal ideation indicated by patient, Suicide plan, Plan includes specific time, place, or method, Self-harm thoughts, Self-harm behaviors, Intention to act on suicide plan  Demographic Factors:  46 year old male, employed , lives with GF  Loss Factors: Retired from [employer] 2 years ago, due to injuries   Historical Factors: No prior psychiatric admissions, history of depression, history of prior suicidal attempt by overdosing years ago, history of anxiety  Risk Reduction Factors:   Responsible for children under 48 years of age, Sense of responsibility to family, Employed, Living with another person, especially a relative and Positive coping skills or problem solving skills  Continued Clinical Symptoms:  At this time patient is alert , attentive, well related, pleasant, mood is improved , denies feeling depressed at this time , presents euthymic, affect appropriate, reactive, no thought disorder, no suicidal or self injurious ideations, no homicidal ideations, no hallucinations , no delusions. Future oriented. Denies medication side effects.  Feels Seroquel has been effective for mood and sleep. We reviewed side effects, to include potential for sedation, weight gain, metabolic disturbances, and movement disorders on Seroquel Behavior on unit in good control.   Cognitive Features That Contribute To Risk:  No gross cognitive deficits noted upon discharge. Is alert , attentive, and oriented x 3   Suicide Risk:  Mild:  Suicidal ideation of limited frequency, intensity, duration, and specificity.  There are no identifiable plans, no associated intent, mild dysphoria and related symptoms, good self-control (both objective and subjective assessment), few other risk factors, and identifiable protective factors, including available  and accessible social support.  Follow-up Information    [facility] Follow up.   Specialty:  Behavioral  Health Why:  Please go within 1-3 days of discharge to be assessed for the intensive outpatient program and medication management. Walk in hours are from 12:30pm-2:45pm M, W, and F Contact information: [address] [place] [address] [phone]           Plan Of Care/Follow-up recommendations:  Activity:  as tolerated  Diet:  Regular Tests:  NA Other:  See below  Patient is leaving unit in good spirits  Plans to return home Plans to follow up as above  Patient states he plans to get a PCP soon in order to start well checks, treatment if necessary.   Nehemiah Massed, MD 02/26/2016, 11:40 AM

## 2016-02-26 NOTE — Progress Notes (Signed)
  [facility] :  Will you be returning to the same living situation after discharge:  Yes,  home  At discharge, do you have transportation home?: Yes,  Significant other  Do you have the ability to pay for your medications: Yes,  mental health   Release of information consent forms completed and in the chart;  Patient's signature needed at discharge.  Patient to Follow up at: Follow-up Information    [facility] Follow up.   Specialty:  Behavioral Health Why:  Please go within 1-3 days of discharge to be assessed for the intensive outpatient program and medication management. Walk in hours are from 12:30pm-2:45pm M, W, and F Contact information: [address] [place] [address] [phone]           Next level of care provider [facility] Link:no  Safety Planning and Suicide Prevention discussed: Yes,  with patient and significant  other   Have you used any form of tobacco in the last 30 days? (Cigarettes, Smokeless Tobacco, Cigars, and/or Pipes): No  Has patient been referred to the Quitline?: N/A patient is not a smoker  Patient has been referred for addiction treatment: Yes  Russell Allegraandace L Camrynn Mcclintic MSW, LCSWA  02/26/2016, 10:54 AM

## 2016-02-26 NOTE — BHH Group Notes (Signed)
Adult Psychoeducational Group Note  Date:  02/26/2016 Time:  10:54 AM  Group Topic/Focus:  Goals Group:   The focus of this group is to help patients establish daily goals to achieve during treatment and discuss how the patient can incorporate goal setting into their daily lives to aide in recovery.  Participation Level:  Active  Participation Quality:  Appropriate  Affect:  Appropriate  Cognitive:  Appropriate  Insight: Appropriate and Good  Engagement in Group:  Engaged  Modes of Intervention:  Discussion  Additional Comments:  Pt stated that he would rate his day 10 because he is being discharged today. He said a positive thing about himself is that he enjoys helping other reach their goals.   Berlin HunWatlington, Arwa Yero A 02/26/2016, 10:54 AM

## 2016-02-26 NOTE — Progress Notes (Signed)
Patient ID: Russell Calhoun, male   DOB: 07/05/1970, 46 y.o.   MRN: [id number]  DAR: Pt. Denies SI/HI and A/V Hallucinations. He reports sleep is good, appetite is good, energy level is normal, and concentration is good. He rates depression 5/10, hopelessness 4/10, and anxiety 5/10. Patient does not report any pain or discomfort at this time. Support and encouragement provided to the patient. Patient is receptive and cooperative. Patient is seen in the milieu and reports he is ready for discharge. Q15 minute checks are maintained for safety.

## 2016-03-04 ENCOUNTER — Ambulatory Visit (HOSPITAL_COMMUNITY): Payer: Self-pay | Admitting: Psychology

## 2016-03-05 ENCOUNTER — Telehealth: Payer: Self-pay | Admitting: Medical

## 2016-03-05 NOTE — Telephone Encounter (Signed)
This pt was admitted for suicidal ideations/attempt. He had established with other clinic but I am listed as pcp? He needs to be called to see who he is following up with. If with me he needs 30 minute follow up. 15 minute is too little time in light of what occurred. Please pass this along.

## 2016-03-06 NOTE — Telephone Encounter (Signed)
7:44 AM  Note    Patient is currently Admitted in Behavioral Health [hospital] as of 02/20/16 evening [in chart]/SLS 01/24    February 20, 2016   9:58 PM  Esperanza RichtersEdward Saguier, PA-C routed this conversation to Me . Tylene FantasiaAshlee S Langston, RN  Esperanza RichtersEdward Saguier, PA-C    9:52 PM  Note    I have not seen pt in one year and a half. Pt has visit in [facility] from [facility] that he saw [facility]. So it appears he has new pcp? Pt was seen in ED for suicide attempt(overdose). Hx of anxiety. ED sent me note and he might be told to follow up with me. I am not opposed if he needs to be seen. But if he chose other provider then it is more appropiate to follow up with them. Depends on what pt prefers. Will you put a note in chart so RN's/whoever coordinates follow up is aware.

## 2016-03-30 ENCOUNTER — Other Ambulatory Visit (HOSPITAL_COMMUNITY): Payer: Self-pay | Admitting: Psychiatry

## 2017-08-20 ENCOUNTER — Emergency Department (HOSPITAL_BASED_OUTPATIENT_CLINIC_OR_DEPARTMENT_OTHER)
Admission: EM | Admit: 2017-08-20 | Discharge: 2017-08-20 | Disposition: A | Discharge status: Home / Self Care | Payer: Self-pay | Attending: Emergency Medicine | Admitting: Emergency Medicine

## 2017-08-20 ENCOUNTER — Encounter (HOSPITAL_BASED_OUTPATIENT_CLINIC_OR_DEPARTMENT_OTHER): Payer: Self-pay | Admitting: Emergency Medicine

## 2017-08-20 ENCOUNTER — Other Ambulatory Visit: Payer: Self-pay

## 2017-08-20 ENCOUNTER — Emergency Department (HOSPITAL_BASED_OUTPATIENT_CLINIC_OR_DEPARTMENT_OTHER): Payer: Self-pay

## 2017-08-20 DIAGNOSIS — R911 Solitary pulmonary nodule: Secondary | ICD-10-CM | POA: Insufficient documentation

## 2017-08-20 DIAGNOSIS — R1031 Right lower quadrant pain: Secondary | ICD-10-CM | POA: Insufficient documentation

## 2017-08-20 LAB — COMPREHENSIVE METABOLIC PANEL
ALK PHOS: 46 U/L (ref 38–126)
ALT: 49 U/L — ABNORMAL HIGH (ref 0–44)
ANION GAP: 8 (ref 5–15)
AST: 61 U/L — ABNORMAL HIGH (ref 15–41)
Albumin: 3.4 g/dL — ABNORMAL LOW (ref 3.5–5.0)
BUN: 17 mg/dL (ref 6–20)
CALCIUM: 8.4 mg/dL — AB (ref 8.9–10.3)
CO2: 28 mmol/L (ref 22–32)
Chloride: 96 mmol/L — ABNORMAL LOW (ref 98–111)
Creatinine, Ser: 1.46 mg/dL — ABNORMAL HIGH (ref 0.61–1.24)
GFR, EST NON AFRICAN AMERICAN: 55 mL/min — AB (ref >60)
Glucose, Bld: 83 mg/dL (ref 70–99)
Potassium: 4.6 mmol/L (ref 3.5–5.1)
SODIUM: 132 mmol/L — AB (ref 135–145)
TOTAL PROTEIN: 6.6 g/dL (ref 6.5–8.1)
Total Bilirubin: 1.2 mg/dL (ref 0.3–1.2)

## 2017-08-20 LAB — URINALYSIS, ROUTINE W REFLEX MICROSCOPIC
BILIRUBIN URINE: NEGATIVE
Glucose, UA: NEGATIVE mg/dL
Ketones, ur: NEGATIVE mg/dL
Leukocytes, UA: NEGATIVE
Nitrite: NEGATIVE
Protein, ur: NEGATIVE mg/dL
SPECIFIC GRAVITY, URINE: 1.01 (ref 1.005–1.030)
pH: 7.5 (ref 5.0–8.0)

## 2017-08-20 LAB — CBC WITH DIFFERENTIAL/PLATELET
Basophils Absolute: 0 10*3/uL (ref 0.0–0.1)
Basophils Relative: 0 %
Eosinophils Absolute: 0.2 10*3/uL (ref 0.0–0.7)
Eosinophils Relative: 2 %
HCT: 40.3 % (ref 39.0–52.0)
HEMOGLOBIN: 13.3 g/dL (ref 13.0–17.0)
LYMPHS ABS: 1.9 10*3/uL (ref 0.7–4.0)
Lymphocytes Relative: 19 %
MCH: 26.8 pg (ref 26.0–34.0)
MCHC: 33 g/dL (ref 30.0–36.0)
MCV: 81.3 fL (ref 78.0–100.0)
MONO ABS: 0.7 10*3/uL (ref 0.1–1.0)
Monocytes Relative: 7 %
Neutro Abs: 7 10*3/uL (ref 1.7–7.7)
Neutrophils Relative %: 72 %
PLATELETS: 315 10*3/uL (ref 150–400)
RBC: 4.96 MIL/uL (ref 4.22–5.81)
RDW: 16.1 % — ABNORMAL HIGH (ref 11.5–15.5)
WBC: 9.7 10*3/uL (ref 4.0–10.5)

## 2017-08-20 LAB — URINALYSIS, MICROSCOPIC (REFLEX)

## 2017-08-20 MED ORDER — IOPAMIDOL (ISOVUE-300) INJECTION 61%
100.0000 mL | Freq: Once | INTRAVENOUS | Status: AC | PRN
  Administered 2017-08-20: 100 mL via INTRAVENOUS

## 2017-08-20 MED ORDER — IBUPROFEN 800 MG PO TABS: 800.0000 mg | ORAL_TABLET | Freq: Three times a day (TID) | ORAL | 0 refills | Status: DC | PRN

## 2017-08-20 NOTE — ED Provider Notes (Signed)
Emergency Department Provider Note   I have reviewed the triage vital signs and the nursing notes.   HISTORY  Chief Complaint Groin Pain   HPI Russell Calhoun is a 47 y.o. male with PMH of GAD presents to the ED with painful mass in the right inguinal area.  Symptoms have been present for the past 5 days.  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  He denies pain at rest but states whenever he strains his abdominal muscles, especially with standing, he has pain in this area and a small mass is appreciated.  He denies any nausea or vomiting.  He continues to have bowel movements.  Patient does state that his abdomen is more bloated than normal which began to concern him and he states he has had a known history of elevated liver enzymes.  No fevers or chills.   Past Medical History:  Diagnosis Date  . Anxiety   . Depression   . Substance abuse [facility])     Patient Active Problem List   Diagnosis Date Noted  . Major depressive disorder, recurrent severe without psychotic features (HCC) 02/20/2016  . Generalized anxiety disorder 05/23/2014  . Erectile dysfunction 05/23/2014  . Insomnia 05/23/2014  . Gastroenteritis 05/23/2014    History reviewed. No pertinent surgical history.  Allergies Patient has no known allergies.  Family History  Problem Relation Age of Onset  . Mental illness Mother   . HIV/AIDS Father     Social History Social History   Tobacco Use  . Smoking status: Never Smoker  . Smokeless tobacco: Never Used  Substance Use Topics  . Alcohol use: Yes  . Drug use: Yes    Types: Anabolic steroids    Comment: Cycles on before competition. But none recetnly.    Review of Systems  Constitutional: No fever/chills Eyes: No visual changes. ENT: No sore throat. Cardiovascular: Denies chest pain. Respiratory: Denies shortness of  breath. Gastrointestinal: Positive right inguinal pain with bulging.  No nausea, no vomiting.  No diarrhea.  No constipation. Genitourinary: Negative for dysuria. Musculoskeletal: Negative for back pain. Skin: Negative for rash. Neurological: Negative for headaches, focal weakness or numbness.  10-point ROS otherwise negative.  ____________________________________________   PHYSICAL EXAM:  VITAL SIGNS: ED Triage Vitals  Enc Vitals Group     BP 08/20/17 1833 (!) 142/83     Pulse Rate 08/20/17 1833 61     Resp 08/20/17 1833 18     Temp 08/20/17 1833 98.4 F (36.9 C)     Temp Source 08/20/17 1833 Oral     SpO2 08/20/17 1833 100 %     Weight 08/20/17 1832 160 lb (72.6 kg)     Height 08/20/17 1832 5\' 3"  (1.6 m)     Pain Score 08/20/17 1832 0   Constitutional: Alert and oriented. Well appearing and in no acute distress. Eyes: Conjunctivae are normal.  Head: Atraumatic. Nose: No congestion/rhinnorhea. Mouth/Throat: Mucous membranes are moist.  Neck: No stridor.  Cardiovascular: Normal rate, regular rhythm. Good peripheral circulation. Grossly normal heart sounds.   Respiratory: Normal respiratory effort.  No retractions. Lungs CTAB. Gastrointestinal: Soft and nontender. Palpable, reducible mass in the right inguinal canal. No distention.  Musculoskeletal: No lower extremity tenderness nor edema. No gross deformities of extremities. Neurologic:  Normal speech and language. No gross focal neurologic deficits are appreciated.  Skin:  Skin is warm,  dry and intact. No rash noted.  ____________________________________________   LABS (all labs ordered are listed, but only abnormal results are displayed)  Labs Reviewed  CBC WITH DIFFERENTIAL/PLATELET - Abnormal; Notable for the following components:      Result Value   RDW 16.1 (*)    All other components within normal limits  URINALYSIS, ROUTINE W REFLEX MICROSCOPIC - Abnormal; Notable for the following components:   Hgb urine  dipstick MODERATE (*)    All other components within normal limits  COMPREHENSIVE METABOLIC PANEL - Abnormal; Notable for the following components:   Sodium 132 (*)    Chloride 96 (*)    Creatinine, Ser 1.46 (*)    Calcium 8.4 (*)    Albumin 3.4 (*)    AST 61 (*)    ALT 49 (*)    GFR calc non Af Amer 55 (*)    All other components within normal limits  URINALYSIS, MICROSCOPIC (REFLEX) - Abnormal; Notable for the following components:   Bacteria, UA RARE (*)    All other components within normal limits   ____________________________________________  RADIOLOGY  Ct Abdomen Pelvis W Contrast  Result Date: 08/20/2017 CLINICAL DATA:  Right groin pain since Saturday. Lifting heavy weights at the gym. EXAM: CT ABDOMEN AND PELVIS WITH CONTRAST TECHNIQUE: Multidetector CT imaging of the abdomen and pelvis was performed using the standard protocol following bolus administration of intravenous contrast. CONTRAST:  100mL ISOVUE-300 IOPAMIDOL (ISOVUE-300) INJECTION 61% COMPARISON:  05/19/2014 FINDINGS: Lower chest: Nodule in the left lung base measuring 7.1 mm. This area was likely not included on the previous study for comparison. Hepatobiliary: No focal liver abnormality is seen. No gallstones, gallbladder wall thickening, or biliary dilatation. Pancreas: Unremarkable. No pancreatic ductal dilatation or surrounding inflammatory changes. Spleen: Normal in size without focal abnormality. Adrenals/Urinary Tract: Adrenal glands are unremarkable. Kidneys are normal, without renal calculi, focal lesion, or hydronephrosis. Bladder wall is diffusely thickened, possibly indicating cystitis or possibly due to under distention. Stomach/Bowel: Stomach, small bowel, and colon are mostly decompressed. No wall thickening or infiltrative changes identified. Scattered stool in the colon. Appendix is not identified. Vascular/Lymphatic: No significant vascular findings are present. No enlarged abdominal or pelvic lymph  nodes. Nonpathologic lymph nodes in the groin regions. These could be inflammatory. No change since previous study. Reproductive: Prostate gland is not enlarged. Other: No free air or free fluid in the abdomen. Abdominal wall musculature appears intact. No evidence of inguinal or femoral hernias. Musculoskeletal: No acute or significant osseous findings. IMPRESSION: 1. Bladder wall thickening may indicate cystitis or possibly due to under distention. 2.  No evidence of femoral or inguinal hernia. 3. 7.1 mm nodule in the left lung base. Non-contrast chest CT at 6-12 months is recommended. If the nodule is stable at time of repeat CT, then future CT at 18-24 months (from today's scan) is considered optional for low-risk patients, but is recommended for high-risk patients. This recommendation follows the consensus statement: Guidelines for Management of Incidental Pulmonary Nodules Detected on CT Images: From the Fleischner Society 2017; Radiology 2017; 284:228-243. Electronically Signed   By: Burman NievesWilliam  Stevens M.D.   On: 08/20/2017 21:13    ____________________________________________   PROCEDURES  Procedure(s) performed:   Procedures  None ____________________________________________   INITIAL IMPRESSION / ASSESSMENT AND PLAN / ED COURSE  Pertinent labs & imaging results that were available during my care of the patient were reviewed by me and considered in my medical decision making (see chart for details).  Patient presents  to the emergency department for evaluation of right groin pain.  His exam shows concern for possible hernia.  The area is reducible but the patient has had worsening pain and feels like his abdomen is more distended.  Because of this, plan for CT imaging of the abdomen and pelvis along with baseline labs.  He is not requiring pain medication at this time.  Plan for outpatient surgery referral.  CT imaging and labs reviewed. No visible hernia but clinically concern for one.  Will treat symptoms at this time and refer to General Surgery. I did discuss the lung nodule with the patient and wrote information regarding follow up on the discharge paperwork.   At this time, I do not feel there is any life-threatening condition present. I have reviewed and discussed all results (EKG, imaging, lab, urine as appropriate), exam findings with patient. I have reviewed nursing notes and appropriate previous records.  I feel the patient is safe to be discharged home without further emergent workup. Discussed usual and customary return precautions. Patient and family (if present) verbalize understanding and are comfortable with this plan.  Patient will follow-up with their primary care provider. If they do not have a primary care provider, information for follow-up has been provided to them. All questions have been answered.  ____________________________________________  FINAL CLINICAL IMPRESSION(S) / ED DIAGNOSES  Final diagnoses:  Right inguinal pain  Pulmonary nodule     MEDICATIONS GIVEN DURING THIS VISIT:  Medications  iopamidol (ISOVUE-300) 61 % injection 100 mL (100 mLs Intravenous Contrast Given 08/20/17 2045)     NEW OUTPATIENT MEDICATIONS STARTED DURING THIS VISIT:  Discharge Medication List as of 08/20/2017  9:49 PM    START taking these medications   Details  ibuprofen (ADVIL,MOTRIN) 800 MG tablet Take 1 tablet (800 mg total) by mouth every 8 (eight) hours as needed., Starting Wed 08/20/2017, Print        Note:  This document was prepared using Dragon voice recognition software and may include unintentional dictation errors.  Alona Bene, MD Emergency Medicine    Marshall Kampf, Arlyss Repress, MD 08/20/17 [phone]

## 2017-08-20 NOTE — ED Triage Notes (Signed)
Reports to ER for right groin pain since Saturday.  Reports lifting heavy weights at the gym.

## 2017-08-20 NOTE — ED Notes (Signed)
Patient transported to CT 

## 2017-08-20 NOTE — Discharge Instructions (Signed)
As we discussed, your suffering from an intermittent inguinal hernia.  Please read through the included discharge instructions.  Although you do not need urgent or emergent surgery, it is very important that you follow-up with a surgeon as indicated in these documents to discuss definitive treatment of this condition with a surgical procedure. ° °Please apply pressure to the affected area with your hand when you are getting ready to cough, sneeze, get out of bed, or otherwise strain.  This may help prevent the hernia from "popping out".  He should also look at your local pharmacy to see if they have a binder or other holster-like device that applies constant pressure to the area. ° °Take regular over-the-counter pain medicine as indicated on the labels and any prescriptions provided today.  Follow-up as indicated in these documents.  Return to the emergency department if he develop new or worsening pain or if you are concerned that the hernia has come out and is stuck, resulting in nausea and vomiting, inability to have bowel movement or passed gas, fever or chills, or other symptoms that concern you. ° ° °Hernia °A hernia occurs when an internal organ pushes out through a weak spot in the abdominal wall. Hernias most commonly occur in the groin and around the navel. Hernias often can be pushed back into place (reduced). Most hernias tend to get worse over time. Some abdominal hernias can get stuck in the opening (irreducible or incarcerated hernia) and cannot be reduced. An irreducible abdominal hernia which is tightly squeezed into the opening is at risk for impaired blood supply (strangulated hernia). A strangulated hernia is a medical emergency. Because of the risk for an irreducible or strangulated hernia, surgery may be recommended to repair a hernia. °CAUSES  °Heavy lifting. °Prolonged coughing. °Straining to have a bowel movement. °A cut (incision) made during an abdominal surgery. °HOME CARE INSTRUCTIONS    °Bed rest is not required. You may continue your normal activities. °Avoid lifting more than 10 pounds (4.5 kg) or straining. °Cough gently. If you are a smoker it is best to stop. Even the best hernia repair can break down with the continual strain of coughing. Even if you do not have your hernia repaired, a cough will continue to aggravate the problem. °Do not wear anything tight over your hernia. Do not try to keep it in with an outside bandage or truss. These can damage abdominal contents if they are trapped within the hernia sac. °Eat a normal diet. °Avoid constipation. Straining over Denora Wysocki periods of time will increase hernia size and encourage breakdown of repairs. If you cannot do this with diet alone, stool softeners may be used. °SEEK IMMEDIATE MEDICAL CARE IF:  °You have a fever. °You develop increasing abdominal pain. °You feel nauseous or vomit. °Your hernia is stuck outside the abdomen, looks discolored, feels hard, or is tender. °You have any changes in your bowel habits or in the hernia that are unusual for you. °You have increased pain or swelling around the hernia. °You cannot push the hernia back in place by applying gentle pressure while lying down. °MAKE SURE YOU:  °Understand these instructions. °Will watch your condition. °Will get help right away if you are not doing well or get worse. °Document Released: 01/14/2005 Document Revised: 04/08/2011 Document Reviewed: 09/03/2007 °ExitCare® Patient Information ©2015 [employer]. This information is not intended to replace advice given to you by your health care provider. Make sure you discuss any questions you have with your health care   provider. ° °

## 2017-09-11 ENCOUNTER — Emergency Department (HOSPITAL_BASED_OUTPATIENT_CLINIC_OR_DEPARTMENT_OTHER)
Admission: EM | Admit: 2017-09-11 | Discharge: 2017-09-11 | Disposition: A | Discharge status: Home / Self Care | Payer: Self-pay | Attending: Emergency Medicine | Admitting: Emergency Medicine

## 2017-09-11 ENCOUNTER — Encounter (HOSPITAL_BASED_OUTPATIENT_CLINIC_OR_DEPARTMENT_OTHER): Payer: Self-pay

## 2017-09-11 ENCOUNTER — Other Ambulatory Visit: Payer: Self-pay

## 2017-09-11 DIAGNOSIS — Z79899 Other long term (current) drug therapy: Secondary | ICD-10-CM | POA: Insufficient documentation

## 2017-09-11 DIAGNOSIS — K4020 Bilateral inguinal hernia, without obstruction or gangrene, not specified as recurrent: Secondary | ICD-10-CM

## 2017-09-11 NOTE — Discharge Instructions (Addendum)
Your exam today was consistent with the bilateral inguinal hernias that are reducing normally.  As they were not stuck outside today, I have a low suspicion for incarceration of them.  Given your lack of other symptoms, we do not feel you need further imaging or surgical admission at this time.  Please consider using the inguinal hernia belts we discussed and follow-up with your surgeon.  If any symptoms change or worsen or you cannot reduce them, please return to the nearest emergency department.

## 2017-09-11 NOTE — ED Notes (Signed)
ED Provider at bedside. 
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
## 2017-09-11 NOTE — ED Triage Notes (Addendum)
C/o bilat groin pain-states he was seen here dx with hernia-f/u with surgeon today but there was a miscommunication about appt time and he was told he was too late for appt -NAD-steady gait

## 2017-10-14 ENCOUNTER — Emergency Department (HOSPITAL_BASED_OUTPATIENT_CLINIC_OR_DEPARTMENT_OTHER): Payer: Self-pay

## 2017-10-14 ENCOUNTER — Other Ambulatory Visit: Payer: Self-pay

## 2017-10-14 ENCOUNTER — Encounter (HOSPITAL_BASED_OUTPATIENT_CLINIC_OR_DEPARTMENT_OTHER): Payer: Self-pay | Admitting: Emergency Medicine

## 2017-10-14 ENCOUNTER — Emergency Department (HOSPITAL_BASED_OUTPATIENT_CLINIC_OR_DEPARTMENT_OTHER)
Admission: EM | Admit: 2017-10-14 | Discharge: 2017-10-14 | Disposition: A | Discharge status: Home / Self Care | Payer: Self-pay | Attending: Emergency Medicine | Admitting: Emergency Medicine

## 2017-10-14 DIAGNOSIS — R1031 Right lower quadrant pain: Secondary | ICD-10-CM | POA: Insufficient documentation

## 2017-10-14 DIAGNOSIS — K047 Periapical abscess without sinus: Secondary | ICD-10-CM | POA: Insufficient documentation

## 2017-10-14 DIAGNOSIS — Z79899 Other long term (current) drug therapy: Secondary | ICD-10-CM | POA: Insufficient documentation

## 2017-10-14 LAB — COMPREHENSIVE METABOLIC PANEL
ALT: 39 U/L (ref 0–44)
AST: 33 U/L (ref 15–41)
Albumin: 3.9 g/dL (ref 3.5–5.0)
Alkaline Phosphatase: 39 U/L (ref 38–126)
Anion gap: 9 (ref 5–15)
BILIRUBIN TOTAL: 1.4 mg/dL — AB (ref 0.3–1.2)
BUN: 18 mg/dL (ref 6–20)
CHLORIDE: 102 mmol/L (ref 98–111)
CO2: 26 mmol/L (ref 22–32)
CREATININE: 1.05 mg/dL (ref 0.61–1.24)
Calcium: 9.2 mg/dL (ref 8.9–10.3)
GFR calc Af Amer: >60 mL/min (ref >60)
GLUCOSE: 103 mg/dL — AB (ref 70–99)
Potassium: 4 mmol/L (ref 3.5–5.1)
Sodium: 137 mmol/L (ref 135–145)
Total Protein: 6.8 g/dL (ref 6.5–8.1)

## 2017-10-14 LAB — CBC WITH DIFFERENTIAL/PLATELET
BASOS ABS: 0 10*3/uL (ref 0.0–0.1)
Basophils Relative: 0 %
Eosinophils Absolute: 0.1 10*3/uL (ref 0.0–0.7)
Eosinophils Relative: 1 %
HEMATOCRIT: 46.2 % (ref 39.0–52.0)
Hemoglobin: 16.1 g/dL (ref 13.0–17.0)
LYMPHS PCT: 21 %
Lymphs Abs: 2.1 10*3/uL (ref 0.7–4.0)
MCH: 27.8 pg (ref 26.0–34.0)
MCHC: 34.8 g/dL (ref 30.0–36.0)
MCV: 79.8 fL (ref 78.0–100.0)
Monocytes Absolute: 0.6 10*3/uL (ref 0.1–1.0)
Monocytes Relative: 6 %
NEUTROS ABS: 7.3 10*3/uL (ref 1.7–7.7)
Neutrophils Relative %: 72 %
PLATELETS: 224 10*3/uL (ref 150–400)
RBC: 5.79 MIL/uL (ref 4.22–5.81)
RDW: 16.8 % — ABNORMAL HIGH (ref 11.5–15.5)
WBC: 10.1 10*3/uL (ref 4.0–10.5)

## 2017-10-14 LAB — URINALYSIS, MICROSCOPIC (REFLEX): WBC, UA: NONE SEEN WBC/hpf (ref 0–5)

## 2017-10-14 LAB — I-STAT CG4 LACTIC ACID, ED: Lactic Acid, Venous: 0.87 mmol/L (ref 0.5–1.9)

## 2017-10-14 LAB — URINALYSIS, ROUTINE W REFLEX MICROSCOPIC
Bilirubin Urine: NEGATIVE
GLUCOSE, UA: NEGATIVE mg/dL
KETONES UR: NEGATIVE mg/dL
LEUKOCYTES UA: NEGATIVE
Nitrite: NEGATIVE
Protein, ur: NEGATIVE mg/dL
SPECIFIC GRAVITY, URINE: 1.01 (ref 1.005–1.030)
pH: 5.5 (ref 5.0–8.0)

## 2017-10-14 LAB — LIPASE, BLOOD: LIPASE: 46 U/L (ref 11–51)

## 2017-10-14 MED ORDER — OXYCODONE-ACETAMINOPHEN 5-325 MG PO TABS: 1.0000 | ORAL_TABLET | ORAL | 0 refills | Status: AC | PRN

## 2017-10-14 MED ORDER — IOPAMIDOL (ISOVUE-300) INJECTION 61%
100.0000 mL | Freq: Once | INTRAVENOUS | Status: AC | PRN
  Administered 2017-10-14: 100 mL via INTRAVENOUS

## 2017-10-14 MED ORDER — PENICILLIN V POTASSIUM 500 MG PO TABS: 500.0000 mg | ORAL_TABLET | Freq: Four times a day (QID) | ORAL | 0 refills | Status: AC

## 2017-10-14 MED FILL — PENICILLIN VK 500 MG TABLET: 500 | 7 days supply | Qty: 28 | Fill #0

## 2017-10-14 MED FILL — OXYCODONE-ACETAMINOPHEN 5-3: 5-325 | 3 days supply | Qty: 15 | Fill #0

## 2017-10-14 NOTE — ED Provider Notes (Signed)
[facility] EMERGENCY DEPARTMENT Provider Note   CSN: [id number] Arrival date & time: 10/14/17  0737     History   Chief Complaint Chief Complaint  Patient presents with  . Abdominal Pain  . Dental Pain    HPI Russell Calhoun is a 47 y.o. male.  The history is provided by the patient and medical records. No language interpreter was used.  Abdominal Pain   This is a recurrent problem. The current episode started yesterday. The problem occurs constantly. The problem has not changed since onset.The pain is associated with an unknown (hernia and lifting) factor. The pain is located in the suprapubic region and RLQ. The quality of the pain is aching and sharp. The pain is at a severity of 9/10. The pain is severe. Associated symptoms include nausea, vomiting and constipation. Pertinent negatives include fever, diarrhea, dysuria, frequency, hematuria and headaches. The symptoms are aggravated by palpation. The symptoms are relieved by being still. Past medical history comments: hernia.    Past Medical History:  Diagnosis Date  . Anxiety   . Depression   . Substance abuse [facility])     Patient Active Problem List   Diagnosis Date Noted  . Major depressive disorder, recurrent severe without psychotic features (HCC) 02/20/2016  . Generalized anxiety disorder 05/23/2014  . Erectile dysfunction 05/23/2014  . Insomnia 05/23/2014  . Gastroenteritis 05/23/2014    History reviewed. No pertinent surgical history.      Home Medications    Prior to Admission medications   Medication Sig Start Date End Date Taking? Authorizing Provider  acetaminophen (TYLENOL) 500 MG tablet Take 1 tablet (500 mg total) by mouth every 6 (six) hours as needed. For headache. 02/26/16   Armandina Stammer I, NP  ibuprofen (ADVIL,MOTRIN) 800 MG tablet Take 1 tablet (800 mg total) by mouth every 8 (eight) hours as needed. 08/20/17   Long, Arlyss Repress, MD  QUEtiapine (SEROQUEL) 25 MG tablet Take 1 tablet (25 mg  total) by mouth 2 (two) times daily as needed (racing thoughts). 02/26/16   Armandina Stammer I, NP  QUEtiapine (SEROQUEL) 50 MG tablet Take 3 tablets (150 mg total) by mouth at bedtime. For mood control 02/26/16   Armandina Stammer I, NP  venlafaxine XR (EFFEXOR-XR) 150 MG 24 hr capsule Take 1 capsule (150 mg total) by mouth daily with breakfast. For depression 02/27/16   Sanjuana Kava, NP    Family History Family History  Problem Relation Age of Onset  . Mental illness Mother   . HIV/AIDS Father     Social History Social History   Tobacco Use  . Smoking status: Never Smoker  . Smokeless tobacco: Never Used  Substance Use Topics  . Alcohol use: Not Currently  . Drug use: Not Currently    Types: Anabolic steroids     Allergies   Patient has no known allergies.   Review of Systems Review of Systems  Constitutional: Positive for chills. Negative for diaphoresis, fatigue and fever.  HENT: Negative for congestion.   Respiratory: Negative for cough, chest tightness, shortness of breath and wheezing.   Cardiovascular: Negative for chest pain and palpitations.  Gastrointestinal: Positive for abdominal pain, constipation, nausea and vomiting. Negative for diarrhea.  Genitourinary: Positive for genital sores. Negative for decreased urine volume, difficulty urinating, discharge, dysuria, flank pain, frequency, hematuria, penile pain, scrotal swelling and testicular pain.  Musculoskeletal: Negative for back pain, neck pain and neck stiffness.  Skin: Negative for rash and wound.  Neurological: Negative for  weakness, light-headedness and headaches.  Psychiatric/Behavioral: Negative for agitation.  All other systems reviewed and are negative.    Physical Exam Updated Vital Signs BP 134/75 (BP Location: Right Arm)   Pulse 64   Temp 98.1 F (36.7 C) (Oral)   Resp 18   Ht 5\' 3"  (1.6 m)   Wt 72.6 kg   SpO2 99%   BMI 28.34 kg/m   Physical Exam  Constitutional: He is oriented to person,  place, and time. He appears well-developed and well-nourished. No distress.  HENT:  Head: Normocephalic.  Mouth/Throat: Oropharynx is clear and moist. No trismus in the jaw. Dental caries present. No uvula swelling or lacerations.    Tenderness in the left upper and left lower teeth.  No evidence of drainable abscess seen.  Suspect mild dental infection.  Eyes: Pupils are equal, round, and reactive to light. Conjunctivae are normal.  Neck: Normal range of motion.  Cardiovascular: Normal rate.  No murmur heard. Pulmonary/Chest: Effort normal. No respiratory distress. He has no wheezes. He exhibits no tenderness.  Abdominal: Soft. He exhibits no distension. There is tenderness in the right lower quadrant and suprapubic area. There is no rigidity, no rebound and no CVA tenderness. A hernia is present. Hernia confirmed positive in the right inguinal area. Hernia confirmed negative in the left inguinal area.    Genitourinary: Penis normal. Cremasteric reflex is present. Right testis shows no mass and no tenderness. Left testis shows no mass and no tenderness. No penile tenderness.     Musculoskeletal: He exhibits no tenderness.  Neurological: He is alert and oriented to person, place, and time. No sensory deficit. He exhibits normal muscle tone.  Skin: Capillary refill takes less than 2 seconds. He is not diaphoretic. No erythema.  Nursing note and vitals reviewed.    ED Treatments / Results  Labs (all labs ordered are listed, but only abnormal results are displayed) Labs Reviewed  CBC WITH DIFFERENTIAL/PLATELET - Abnormal; Notable for the following components:      Result Value   RDW 16.8 (*)    All other components within normal limits  COMPREHENSIVE METABOLIC PANEL - Abnormal; Notable for the following components:   Glucose, Bld 103 (*)    Total Bilirubin 1.4 (*)    All other components within normal limits  URINALYSIS, ROUTINE W REFLEX MICROSCOPIC - Abnormal; Notable for the  following components:   Hgb urine dipstick SMALL (*)    All other components within normal limits  URINALYSIS, MICROSCOPIC (REFLEX) - Abnormal; Notable for the following components:   Bacteria, UA RARE (*)    All other components within normal limits  URINE CULTURE  LIPASE, BLOOD  I-STAT CG4 LACTIC ACID, ED  I-STAT CG4 LACTIC ACID, ED    EKG None  Radiology Ct Abdomen Pelvis W Contrast  Result Date: 10/14/2017 CLINICAL DATA:  Abdominal pain and tenderness.  Nausea and vomiting. EXAM: CT ABDOMEN AND PELVIS WITH CONTRAST TECHNIQUE: Multidetector CT imaging of the abdomen and pelvis was performed using the standard protocol following bolus administration of intravenous contrast. CONTRAST:  ISOVUE-300 IOPAMIDOL (ISOVUE-300) INJECTION 61% COMPARISON:  August 20, 2017 FINDINGS: Lower chest: Visualized lung bases are clear. The previously noted 7 mm nodular opacity in the left base region is superior in location to areas scanned on current examination. Hepatobiliary: No focal liver lesions are evident. Gallbladder wall is not appreciably thickened. There is no biliary duct dilatation. Pancreas: No pancreatic mass or inflammatory focus. Spleen: No splenic lesions are evident. Adrenals/Urinary Tract:  Adrenals bilaterally appear normal. Kidneys bilaterally show no evident mass or hydronephrosis on either side. There is no renal or ureteral calculus on either side. Urinary bladder is midline with wall borderline thickened. Stomach/Bowel: There is moderate stool in the sigmoid colon and rectal regions. There is no wall thickening in these areas. There is fluid in multiple loops of bowel without appreciable wall thickening. There is no transition zone to suggest bowel obstruction. No free air or portal venous air is evident. Vascular/Lymphatic: There is no abdominal aortic aneurysm. No vascular lesions are appreciable. There are prominent lymph nodes in the inguinal regions, larger on the right than on the  left. These lymph nodes show evidence of central fatty hila and likely are of inflammatory etiology. This finding was also present on previous study. No adenopathy elsewhere evident in the abdomen or pelvis. Reproductive: Prostate and seminal vesicles are normal in size and contour. There is no evident pelvic mass. Other: There is ascites, primarily along the inferomedial aspect of the liver. The appendix is normal in size and contour without evident appendiceal inflammation. There is no abscess in the abdomen or pelvis. There is mild fat in each inguinal ring. No bowel containing hernia evident. Musculoskeletal: There is degenerative change in the lumbar spine. There is a degree of spinal stenosis at L4-5 due to diffuse disc protrusion and bony hypertrophy. There are no blastic or lytic bone lesions. No intramuscular lesions are evident. IMPRESSION: 1. Suspect a degree of enteritis with fluid throughout much of the bowel. No bowel obstruction evident. No evident free air. 2. Mild ascites, primarily in the right abdomen and pelvis, likely of inflammatory etiology. 3. Prominent inguinal lymph nodes which have a benign appearance. This finding was present on previous study. Suspect reactive type etiology. No inguinal hernias are evident beyond mild fat in each inguinal ring noted. Ascites on the right extends to the level of the inguinal area. 4. Borderline urinary bladder wall thickening. Advise correlation with urinalysis to assess for possible degree of cystitis. 5. No abscess in the abdomen pelvis. Appendix appears unremarkable. 6. Spinal stenosis at L4-5 due to diffuse disc protrusion and bony hypertrophy. Electronically Signed   By: Bretta BangWilliam  Woodruff III M.D.   On: 10/14/2017 09:01    Procedures Procedures (including critical care time)  Medications Ordered in ED Medications  iopamidol (ISOVUE-300) 61 % injection 100 mL (100 mLs Intravenous Contrast Given 10/14/17 0828)     Initial Impression /  Assessment and Plan / ED Course  I have reviewed the triage vital signs and the nursing notes.  Pertinent labs & imaging results that were available during my care of the patient were reviewed by me and considered in my medical decision making (see chart for details).     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  Patient was seen by this examiner several weeks ago.  Patient reports that for the last few months he has had intermittent right inguinal hernia.  He had a left inguinal hernia that reduced before our last encounter and he says it has not given him any trouble since.  He reports however that after his last competition show a few weeks ago in [place], his right inguinal hernia bulged out and has not reduced.  He says that the pain has been intermittent with exertion and continued weight lifting but it has been  manageable with hernia belts.  He says that last night the pain acutely worsened and was a 9 out of 10 in severity.  This was worse than it has been before.  He reports he could not reduce it and had nausea and vomiting.  He reports he is also not had a bowel movement since the pain worsened although he has had continued flatus.  Of note, he reports that he has been using new supplemental teas to help with "detox" after weightlifting and bodybuilding shows.  He is unsure if this is contributing to his symptoms.  He says that he has had several months of chronic dental pains but this also worsened over the last week.  He reports dental pain primarily in his left upper and left lower gums.  He reports having some subjective chills but no fevers at home.  He denies any urinary symptoms.  He denies any cough, congestion, or chest pain.  On exam, patient had tenderness in his right lower quadrant and suprapubic abdomen.  He had a small bulge that was  palpated in his right inguinal area that was not able to be reduced.  His scrotum was unremarkable with no tenderness of the testicles.  No CVA tenderness.  Lungs clear.  Chest nontender.  Patient otherwise resting comfortably.  Dental exam revealed dental caries diffusely with some tenderness in the left lower teeth and left upper teeth.  There was no abscess or bulge seen amenable to drainage.  Patient reports he did not want pain medicine initially as laying still causes pain to be mild.  Given the patient's worsened pain, decreased bowel movement, and his known hernia, patient will have CT imaging to look for incarceration or obstruction.  He will have screen laboratory testing to look for abnormalities related to his supplement use.  Anticipate reassessment after imaging and lab work-up.  If work-up is reassuring, patient will likely be stable for outpatient follow-up with general surgery however patient will be treated for dental infection regardless.     10:48 AM Patient's diagnostic work-up returned reassuring.  Patient found to have no evidence of UTI with negative nitrites and leukocytes.  Lactic acid normal.  CBC reassuring.  Metabolic panel reassuring.  Lipase not elevated.  CT scan shows likely enteritis with fluid in his bowels as well as some inflammatory ascites and no evidence of incarcerated hernia or obstruction.  Suspect the enteritis and fluid is due to the diarrhea possibly from the new supplemental teas he has been taking.  Patient will stop this.  Patient will follow-up with general surgery to discuss further management of his inguinal hernias although do not feel he needs emergent surgery today with no evidence of incarceration or obstruction.  Patient will give prescription for antibiotics for his dental infection and will follow with dentistry.    Patient family understood plan of care and patient was discharged in good condition with understanding of return  precautions   Final Clinical Impressions(s) / ED Diagnoses   Final diagnoses:  Right lower quadrant abdominal pain  Inguinal pain, right  Dental infection    ED Discharge Orders         Ordered    penicillin v potassium (VEETID) 500 MG tablet  4 times daily     10/14/17 1051    oxyCODONE-acetaminophen (PERCOCET/ROXICET) 5-325 MG tablet  Every 4 hours PRN     10/14/17 1051          Clinical Impression: 1. Right lower quadrant abdominal pain  2. Inguinal pain, right   3. Dental infection     Disposition: Discharge  Condition: Good  I have discussed the results, Dx and Tx plan with the pt(& family if present). He/she/they expressed understanding and agree(s) with the plan. Discharge instructions discussed at great length. Strict return precautions discussed and pt &/or family have verbalized understanding of the instructions. No further questions at time of discharge.    New Prescriptions   OXYCODONE-ACETAMINOPHEN (PERCOCET/ROXICET) 5-325 MG TABLET    Take 1 tablet by mouth every 4 (four) hours as needed for severe pain.   PENICILLIN V POTASSIUM (VEETID) 500 MG TABLET    Take 1 tablet (500 mg total) by mouth 4 (four) times daily for 7 days.    Follow Up: Surgery, [facility] [address] [place] [address] [place] [address] [phone]     [facility] [address] [id number] YQ [place] [place] [address] [phone]        Timoteo Carreiro, Canary Brim, MD 10/14/17 1057

## 2017-10-14 NOTE — ED Notes (Signed)
ED Provider at bedside. 

## 2017-10-14 NOTE — ED Notes (Signed)
Patient transported to CT 

## 2017-10-14 NOTE — Discharge Instructions (Signed)
Your CT scan today did not show evidence of incarcerated hernia or obstruction however I do suspect you have inflammation causing your pain in the setting of your hernia.  Please follow-up with a general surgeon to discuss further management options.  Please discontinue the supplement T as this may be contributing to your diarrhea and discomfort.  Please stay hydrated with water and follow-up with your primary doctor as well.  Please follow-up with dentistry and take the antibiotics to help with your likely dental infection.  We did not see evidence of abscess that we could easily drain.  Please use the pain medicine to help with your symptoms and rest.  If any symptoms change or worsen, please return to the nearest emergency department.

## 2017-10-14 NOTE — ED Triage Notes (Addendum)
Pt states that he has inguinal hernia that started hurting yesterday after working. States he is unable to have surgery due to lack of insurance but he hs followed up with a Careers advisersurgeon. He additionally reports swelling to left lower gumline that started this morning. He believes he has a gum infection.

## 2017-10-15 LAB — URINE CULTURE: Culture: NO GROWTH

## 2017-10-18 ENCOUNTER — Emergency Department (HOSPITAL_BASED_OUTPATIENT_CLINIC_OR_DEPARTMENT_OTHER)
Admission: EM | Admit: 2017-10-18 | Discharge: 2017-10-18 | Disposition: A | Discharge status: Home / Self Care | Payer: Self-pay | Attending: Emergency Medicine | Admitting: Emergency Medicine

## 2017-10-18 ENCOUNTER — Other Ambulatory Visit: Payer: Self-pay

## 2017-10-18 ENCOUNTER — Emergency Department (HOSPITAL_BASED_OUTPATIENT_CLINIC_OR_DEPARTMENT_OTHER): Payer: Self-pay

## 2017-10-18 ENCOUNTER — Encounter (HOSPITAL_BASED_OUTPATIENT_CLINIC_OR_DEPARTMENT_OTHER): Payer: Self-pay | Admitting: Emergency Medicine

## 2017-10-18 DIAGNOSIS — Z79899 Other long term (current) drug therapy: Secondary | ICD-10-CM | POA: Insufficient documentation

## 2017-10-18 DIAGNOSIS — M272 Inflammatory conditions of jaws: Secondary | ICD-10-CM | POA: Insufficient documentation

## 2017-10-18 HISTORY — DX: Unilateral inguinal hernia, without obstruction or gangrene, not specified as recurrent: K40.90

## 2017-10-18 LAB — CBC WITH DIFFERENTIAL/PLATELET
BASOS PCT: 0 %
Basophils Absolute: 0 10*3/uL (ref 0.0–0.1)
Eosinophils Absolute: 0.2 10*3/uL (ref 0.0–0.7)
Eosinophils Relative: 2 %
HEMATOCRIT: 43.1 % (ref 39.0–52.0)
HEMOGLOBIN: 14.6 g/dL (ref 13.0–17.0)
LYMPHS PCT: 24 %
Lymphs Abs: 2.6 10*3/uL (ref 0.7–4.0)
MCH: 27.3 pg (ref 26.0–34.0)
MCHC: 33.9 g/dL (ref 30.0–36.0)
MCV: 80.7 fL (ref 78.0–100.0)
MONO ABS: 0.8 10*3/uL (ref 0.1–1.0)
Monocytes Relative: 7 %
NEUTROS ABS: 7.2 10*3/uL (ref 1.7–7.7)
NEUTROS PCT: 67 %
Platelets: 199 10*3/uL (ref 150–400)
RBC: 5.34 MIL/uL (ref 4.22–5.81)
RDW: 16.2 % — ABNORMAL HIGH (ref 11.5–15.5)
WBC: 10.8 10*3/uL — ABNORMAL HIGH (ref 4.0–10.5)

## 2017-10-18 LAB — BASIC METABOLIC PANEL
ANION GAP: 9 (ref 5–15)
BUN: 25 mg/dL — ABNORMAL HIGH (ref 6–20)
CHLORIDE: 103 mmol/L (ref 98–111)
CO2: 24 mmol/L (ref 22–32)
Calcium: 8.5 mg/dL — ABNORMAL LOW (ref 8.9–10.3)
Creatinine, Ser: 1.17 mg/dL (ref 0.61–1.24)
GFR calc Af Amer: >60 mL/min (ref >60)
GFR calc non Af Amer: >60 mL/min (ref >60)
Glucose, Bld: 98 mg/dL (ref 70–99)
POTASSIUM: 3.7 mmol/L (ref 3.5–5.1)
Sodium: 136 mmol/L (ref 135–145)

## 2017-10-18 MED ORDER — CLINDAMYCIN PHOSPHATE 600 MG/50ML IV SOLN
INTRAVENOUS | Status: AC
  Administered 2017-10-18: 600 mg via INTRAVENOUS
  Filled 2017-10-18: qty 50

## 2017-10-18 MED ORDER — CLINDAMYCIN PHOSPHATE 600 MG/50ML IV SOLN
600.0000 mg | Freq: Once | INTRAVENOUS | Status: AC
  Administered 2017-10-18: 600 mg via INTRAVENOUS

## 2017-10-18 MED ORDER — IOPAMIDOL (ISOVUE-300) INJECTION 61%
100.0000 mL | Freq: Once | INTRAVENOUS | Status: AC | PRN
  Administered 2017-10-18: 80 mL via INTRAVENOUS

## 2017-10-18 NOTE — ED Notes (Signed)
Patient transported to CT 

## 2017-10-18 NOTE — ED Notes (Signed)
ED Provider at bedside. 

## 2017-10-18 NOTE — ED Provider Notes (Addendum)
[facility] Provider Note: Lowella DellJ. Lane Basya Casavant, MD, FACEP  CSN: [id number] MRN: [id number] ARRIVAL: 10/18/17 at 0036 ROOM: MH12/[facility]12   CHIEF COMPLAINT  Facial Swelling   HISTORY OF PRESENT ILLNESS  10/18/17 12:46 AM Russell Calhoun is a 47 y.o. male with a history of dental decay.  He is here with pain and swelling of the right side of his face which developed yesterday and is rapidly progressed.  He states his face feels swollen and tight but denies severe pain.  He associates the pain and swelling with his right upper molars.  He has had no fever or difficulty breathing.    Past Medical History:  Diagnosis Date  . Anxiety   . Depression   . Hernia, inguinal, right   . Substance abuse (HCC)     History reviewed. No pertinent surgical history.  Family History  Problem Relation Age of Onset  . Mental illness Mother   . HIV/AIDS Father     Social History   Tobacco Use  . Smoking status: Never Smoker  . Smokeless tobacco: Never Used  Substance Use Topics  . Alcohol use: Not Currently  . Drug use: Not Currently    Types: Anabolic steroids    Prior to Admission medications   Medication Sig Start Date End Date Taking? Authorizing Provider  penicillin v potassium (VEETID) 500 MG tablet Take 1 tablet (500 mg total) by mouth 4 (four) times daily for 7 days. 10/14/17 10/21/17 Yes Tegeler, Canary Brimhristopher J, MD  trazodone (DESYREL) 300 MG tablet Take 300 mg by mouth at bedtime as needed for sleep.   Yes [provider]  acetaminophen (TYLENOL) 500 MG tablet Take 1 tablet (500 mg total) by mouth every 6 (six) hours as needed. For headache. 02/26/16   Armandina StammerNwoko, Agnes I, NP  ibuprofen (ADVIL,MOTRIN) 800 MG tablet Take 1 tablet (800 mg total) by mouth every 8 (eight) hours as needed. 08/20/17   Long, Arlyss RepressJoshua G, MD  oxyCODONE-acetaminophen (PERCOCET/ROXICET) 5-325 MG tablet Take 1 tablet by mouth every 4 (four) hours as needed for severe pain. 10/14/17   Tegeler, Canary Brimhristopher J,  MD  QUEtiapine (SEROQUEL) 25 MG tablet Take 1 tablet (25 mg total) by mouth 2 (two) times daily as needed (racing thoughts). 02/26/16   Armandina StammerNwoko, Agnes I, NP  QUEtiapine (SEROQUEL) 50 MG tablet Take 3 tablets (150 mg total) by mouth at bedtime. For mood control 02/26/16   Armandina StammerNwoko, Agnes I, NP  venlafaxine XR (EFFEXOR-XR) 150 MG 24 hr capsule Take 1 capsule (150 mg total) by mouth daily with breakfast. For depression 02/27/16   Sanjuana KavaNwoko, Agnes I, NP    Allergies Patient has no known allergies.   REVIEW OF SYSTEMS  Negative except as noted here or in the History of Present Illness.   PHYSICAL EXAMINATION  Initial Vital Signs Blood pressure 126/84, pulse 60, temperature 98.3 F (36.8 C), temperature source Oral, height 5\' 3"  (1.6 m), weight 72.6 kg, SpO2 99 %.  Examination General: Well-developed, well-nourished male in no acute distress; appearance consistent with age of record HENT: normocephalic; atraumatic; multiple dental caries; swelling and tenderness of right side of face:    Eyes: pupils equal, round and reactive to light; extraocular muscles intact Neck: supple Heart: regular rate and rhythm Lungs: clear to auscultation bilaterally Abdomen: soft; nondistended; nontender; bowel sounds present Extremities: No deformity; full range of motion; pulses normal Neurologic: Awake, alert and oriented; motor function intact in all extremities and symmetric; no facial droop Skin: Warm and dry  Psychiatric: Normal mood and affect   RESULTS  Summary of this visit's results, reviewed by myself:   EKG Interpretation  Date/Time:    Ventricular Rate:    PR Interval:    QRS Duration:   QT Interval:    QTC Calculation:   R Axis:     Text Interpretation:        Laboratory Studies: Results for orders placed or performed during the hospital encounter of 10/18/17 (from the past 24 hour(s))  CBC with Differential/Platelet     Status: Abnormal   Collection Time: 10/18/17 12:53 AM  Result  Value Ref Range   WBC 10.8 (H) 4.0 - 10.5 K/uL   RBC 5.34 4.22 - 5.81 MIL/uL   Hemoglobin 14.6 13.0 - 17.0 g/dL   HCT 16.1 09.6 - 04.5 %   MCV 80.7 78.0 - 100.0 fL   MCH 27.3 26.0 - 34.0 pg   MCHC 33.9 30.0 - 36.0 g/dL   RDW 40.9 (H) 81.1 - 91.4 %   Platelets 199 150 - 400 K/uL   Neutrophils Relative % 67 %   Neutro Abs 7.2 1.7 - 7.7 K/uL   Lymphocytes Relative 24 %   Lymphs Abs 2.6 0.7 - 4.0 K/uL   Monocytes Relative 7 %   Monocytes Absolute 0.8 0.1 - 1.0 K/uL   Eosinophils Relative 2 %   Eosinophils Absolute 0.2 0.0 - 0.7 K/uL   Basophils Relative 0 %   Basophils Absolute 0.0 0.0 - 0.1 K/uL  Basic metabolic panel     Status: Abnormal   Collection Time: 10/18/17 12:53 AM  Result Value Ref Range   Sodium 136 135 - 145 mmol/L   Potassium 3.7 3.5 - 5.1 mmol/L   Chloride 103 98 - 111 mmol/L   CO2 24 22 - 32 mmol/L   Glucose, Bld 98 70 - 99 mg/dL   BUN 25 (H) 6 - 20 mg/dL   Creatinine, Ser 7.82 0.61 - 1.24 mg/dL   Calcium 8.5 (L) 8.9 - 10.3 mg/dL   GFR calc non Af Amer >60 >60 mL/min   GFR calc Af Amer >60 >60 mL/min   Anion gap 9 5 - 15   Imaging Studies: Ct Maxillofacial W Contrast  Result Date: 10/18/2017 CLINICAL DATA:  47 y/o M; right-sided facial swelling. History of dental disease. EXAM: CT MAXILLOFACIAL WITH CONTRAST TECHNIQUE: Multidetector CT imaging of the maxillofacial structures was performed with intravenous contrast. Multiplanar CT image reconstructions were also generated. CONTRAST:  80mL ISOVUE-300 IOPAMIDOL (ISOVUE-300) INJECTION 61% COMPARISON:  None. FINDINGS: Osseous: No fracture or mandibular dislocation. No destructive process. Dental hardware involving bilateral maxillary and mandibular molars. Streak artifact from the dental hardware partially obscures the face and oral cavity. Orbits: Negative. No traumatic or inflammatory finding. Sinuses: Maxillary sinus mucous retention cyst. Additional paranasal sinuses and the mastoid air cells are normally aerated.  Soft tissues: Extensive edema and swelling of the right facial soft tissues overlying the right maxillary alveolar bone and mandible. No discrete rim enhancing abscess is identified. No extension of inflammation into the deep cervical compartments. Limited intracranial: No significant or unexpected finding. IMPRESSION: Face and oral cavity are partially obscured by streak artifact from dental hardware. Extensive inflammation of the right lower facial superficial soft tissues. No discrete abscess identified at this time. No extension of inflammation into the deep cervical compartments. Electronically Signed   By: Mitzi Hansen M.D.   On: 10/18/2017 01:53    ED COURSE and MDM  Nursing notes and initial vitals  signs, including pulse oximetry, reviewed.  Vitals:   10/18/17 0043 10/18/17 0044  BP:  126/84  Pulse:  60  Temp:  98.3 F (36.8 C)  TempSrc:  Oral  SpO2:  99%  Weight: 72.6 kg   Height: 5\' 3"  (1.6 m)    2:07 AM No frank abscess seen on CT.  Patient advised he will need definitive dental care, we do not have a dentist or oral surgeon on-call this evening.  He was given clindamycin 600 mg IV.  He was seen in the ED on the 17th and given prescriptions for oxycodone and penicillin.  He should continue taking these.  PROCEDURES    ED DIAGNOSES     ICD-10-CM   1. Odontogenic infection of jaw M27.2        Taura Lamarre, MD 10/18/17 0207    Paula Libra, MD 10/18/17 1610    Paula Libra, MD 10/18/17 [id number]

## 2017-10-18 NOTE — ED Triage Notes (Signed)
Pt states he just woke up with right side facial swollen, he is been having some dental problem now he is having the swelling and 9/10 dental pain.

## 2017-11-04 ENCOUNTER — Emergency Department (HOSPITAL_BASED_OUTPATIENT_CLINIC_OR_DEPARTMENT_OTHER)
Admission: EM | Admit: 2017-11-04 | Discharge: 2017-11-04 | Disposition: A | Discharge status: Home / Self Care | Payer: Self-pay | Attending: Emergency Medicine | Admitting: Emergency Medicine

## 2017-11-04 ENCOUNTER — Other Ambulatory Visit: Payer: Self-pay

## 2017-11-04 ENCOUNTER — Encounter (HOSPITAL_BASED_OUTPATIENT_CLINIC_OR_DEPARTMENT_OTHER): Payer: Self-pay | Admitting: *Deleted

## 2017-11-04 DIAGNOSIS — L03211 Cellulitis of face: Secondary | ICD-10-CM | POA: Insufficient documentation

## 2017-11-04 MED ORDER — CLINDAMYCIN HCL 300 MG PO CAPS: 300.0000 mg | ORAL_CAPSULE | Freq: Four times a day (QID) | ORAL | 0 refills | Status: AC

## 2017-11-04 MED ORDER — CLINDAMYCIN HCL 150 MG PO CAPS
300.0000 mg | ORAL_CAPSULE | Freq: Once | ORAL | Status: AC
  Administered 2017-11-04: 300 mg via ORAL
  Filled 2017-11-04: qty 2

## 2017-11-04 NOTE — Discharge Instructions (Addendum)
Take Clindamycin as prescribed and complete the full course. Rinse and spit with listerine after all meals. Return to ER for any worsening or concerning symptoms. Follow up with dentist, referral given.

## 2017-11-04 NOTE — ED Triage Notes (Signed)
Dental pain. Facial swelling and pain.

## 2017-11-04 NOTE — ED Provider Notes (Signed)
[facility] EMERGENCY DEPARTMENT Provider Note   CSN: [id number] Arrival date & time: 11/04/17  2222     History   Chief Complaint Chief Complaint  Patient presents with  . Dental Pain    HPI Russell Calhoun is a 47 y.o. male.  47 year old male presents with complaint of left side facial swelling.  Patient states that he has been seen 4 times recently with various problems including right-sided facial swelling, dental pain, inguinal hernia.  Patient states that the swelling on the right side of his face resolved after taking antibiotics and now has swelling on the left side of his face.  Patient states that he has multiple dental problems and needs to see a dentist however is unable to afford dental care.  Patient denies fever, trauma, drainage.  No other complaints or concerns.     Past Medical History:  Diagnosis Date  . Anxiety   . Depression   . Hernia, inguinal, right   . Substance abuse [facility])     Patient Active Problem List   Diagnosis Date Noted  . Major depressive disorder, recurrent severe without psychotic features (HCC) 02/20/2016  . Generalized anxiety disorder 05/23/2014  . Erectile dysfunction 05/23/2014  . Insomnia 05/23/2014  . Gastroenteritis 05/23/2014    History reviewed. No pertinent surgical history.      Home Medications    Prior to Admission medications   Medication Sig Start Date End Date Taking? Authorizing Provider  clindamycin (CLEOCIN) 300 MG capsule Take 1 capsule (300 mg total) by mouth every 6 (six) hours for 10 days. 11/04/17 11/14/17  Jeannie Fend, PA-C  oxyCODONE-acetaminophen (PERCOCET/ROXICET) 5-325 MG tablet Take 1 tablet by mouth every 4 (four) hours as needed for severe pain. 10/14/17   Tegeler, Canary Brim, MD  trazodone (DESYREL) 300 MG tablet Take 300 mg by mouth at bedtime as needed for sleep.    [provider]    Family History Family History  Problem Relation Age of Onset  . Mental illness  Mother   . HIV/AIDS Father     Social History Social History   Tobacco Use  . Smoking status: Never Smoker  . Smokeless tobacco: Never Used  Substance Use Topics  . Alcohol use: Not Currently  . Drug use: Not Currently    Types: Anabolic steroids     Allergies   Patient has no known allergies.   Review of Systems Review of Systems  Constitutional: Negative for chills and fever.  HENT: Positive for dental problem and facial swelling. Negative for ear pain, trouble swallowing and voice change.   Gastrointestinal: Negative for nausea and vomiting.  Musculoskeletal: Negative for neck pain.  Skin: Negative for rash and wound.  Allergic/Immunologic: Negative for immunocompromised state.  Neurological: Negative for headaches.  Hematological: Negative for adenopathy.  Psychiatric/Behavioral: Negative for confusion.  All other systems reviewed and are negative.    Physical Exam Updated Vital Signs BP (!) 147/80   Pulse 63   Temp 98.5 F (36.9 C) (Oral)   Resp 20   Ht 5\' 3"  (1.6 m)   Wt 72.5 kg   SpO2 100%   BMI 28.31 kg/m   Physical Exam  Constitutional: He is oriented to person, place, and time. He appears well-developed and well-nourished. No distress.  HENT:  Head: Normocephalic and atraumatic.    Mouth/Throat: Uvula is midline, oropharynx is clear and moist and mucous membranes are normal. No trismus in the jaw. Abnormal dentition. Dental caries present. No dental abscesses  or uvula swelling. No oropharyngeal exudate.  Eyes: Conjunctivae are normal.  Neck: Neck supple.  Cardiovascular: Intact distal pulses.  Pulmonary/Chest: Effort normal.  Lymphadenopathy:    He has no cervical adenopathy.  Neurological: He is alert and oriented to person, place, and time.  Skin: Skin is warm and dry. He is not diaphoretic.  Psychiatric: He has a normal mood and affect. His behavior is normal.  Nursing note and vitals reviewed.    ED Treatments / Results  Labs (all  labs ordered are listed, but only abnormal results are displayed) Labs Reviewed - No data to display  EKG None  Radiology No results found.  Procedures Procedures (including critical care time)  Medications Ordered in ED Medications  clindamycin (CLEOCIN) capsule 300 mg (300 mg Oral Given 11/04/17 2309)     Initial Impression / Assessment and Plan / ED Course  I have reviewed the triage vital signs and the nursing notes.  Pertinent labs & imaging results that were available during my care of the patient were reviewed by me and considered in my medical decision making (see chart for details).  Clinical Course as of Nov 04 2309  Tue Nov 04, 2017  2310 47yo male with swelling to left cheek/lips. Not on ace inhibitors, appears consistent with facial cellulitis, multiple dental carries without obvious abscess. Patient reports developing a rash after taking PCN recently. Given rx for clindamycin, referral to dentist. Return to er for worsening or concerning symptoms.    [LM]    Clinical Course User Index [LM] Jeannie Fend, PA-C   Final Clinical Impressions(s) / ED Diagnoses   Final diagnoses:  Facial cellulitis    ED Discharge Orders         Ordered    clindamycin (CLEOCIN) 300 MG capsule  Every 6 hours     11/04/17 2302           Jeannie Fend, PA-C 11/04/17 2311    Melene Plan, DO 11/04/17 2325

## 2017-11-26 NOTE — Progress Notes (Signed)
Patient ID: Russell Calhoun, male   DOB: 12/25/1970, 47 y.o.   MRN: [id number]      Russell Calhoun, is a 47 y.o. male  WUJ:[id number]  [id number]  DOB - [birth year]  Subjective:  Chief Complaint and HPI: Russell Calhoun is a 47 y.o. male here today to establish care and for a follow up visit After being seen in the ED for cellulitis of the face 11/04/2017.  Here today to establish care and to get a referral for hernia surgery.  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  Hernias bulge when he lifts.  +painful.  He has had to cut back on the amount he lifts.    From A/P: 47yo male with swelling to left cheek/lips. Not on ace inhibitors, appears consistent with facial cellulitis, multiple dental carries without obvious abscess. Patient reports developing a rash after taking PCN recently. Given rx for clindamycin, referral to dentist. Return to er for worsening or concerning symptoms  ED/Hospital notes reviewed.   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 EENT:  No vision changes, No blurry vision, No hearing changes. No mouth, throat, or ear problems.  Respiratory: No cough, No SOB Cardiac: No CP, no palpitations GI:  No abd pain, No N/V/D. GU: No Urinary s/sx Musculoskeletal: No joint pain Neuro: No headache, no dizziness, no motor weakness.  Skin: No rash Endocrine:  No polydipsia. No polyuria.  Psych: Denies SI/HI  No problems updated.  ALLERGIES: No Known Allergies  PAST MEDICAL HISTORY: Past Medical History:  Diagnosis Date  . Anxiety   . Depression   . Hernia, inguinal, right   . Substance abuse (HCC)     MEDICATIONS AT HOME: Prior to Admission medications   Medication Sig Start Date End Date Taking? Authorizing Provider  trazodone (DESYREL) 300 MG tablet Take 300 mg by mouth at bedtime as needed for sleep.   Yes [provider]    oxyCODONE-acetaminophen (PERCOCET/ROXICET) 5-325 MG tablet Take 1 tablet by mouth every 4 (four) hours as needed for severe pain. Patient not taking: Reported on 11/27/2017 10/14/17   Tegeler, Canary Brim, MD     Objective:  EXAM:   Vitals:   11/27/17 1438  BP: 108/66  Pulse: 60  Resp: 16  Temp: 98.4 F (36.9 C)  TempSrc: Oral  SpO2: 94%  Weight: 160 lb (72.6 kg)  Height: 5\' 3"  (1.6 m)    General appearance : A&OX3. NAD. Non-toxic-appearing HEENT: Atraumatic and Normocephalic.  PERRLA. EOM intact.   Neck: supple, no JVD. No cervical lymphadenopathy. No thyromegaly Chest/Lungs:  Breathing-non-labored, Good air entry bilaterally, breath sounds normal without rales, rhonchi, or wheezing  CVS: S1 S2 regular, no murmurs, gallops, rubs  Large and bulging inguinal hernia and smaller one on L.  Partially reducible.  No sign of strangulation, non-tender Extremities: Bilateral Lower Ext shows no edema, both legs are warm to touch with = pulse throughout Neurology:  CN II-XII grossly intact, Non focal.   Psych:  TP linear. J/I WNL. Normal speech. Appropriate eye contact and affect.  Skin:  No Rash  Data Review Lab Results  Component Value Date   HGBA1C 5.2 02/24/2016     Assessment & Plan   1. Non-recurrent bilateral inguinal hernia without obstruction or gangrene Information given.  No heavy lifting - Ambulatory referral to General Surgery  2. Encounter for examination  following treatment at hospital Improved from hospital visit.     Patient have been counseled extensively about nutrition and exercise  Return in about 1 month (around 12/27/2017) for assign PCP.  The patient was given clear instructions to go to ER or return to medical center if symptoms don't improve, worsen or new problems develop. The patient verbalized understanding. The patient was told to call to get lab results if they haven't heard anything in the next week.     Georgian Co, PA-C [facility] [place], [place] [phone]   11/27/2017, 3:08 PM

## 2017-11-27 ENCOUNTER — Ambulatory Visit: Payer: Self-pay | Attending: Family Medicine | Admitting: Physician Assistant

## 2017-11-27 VITALS — BP 108/66 | HR 60 | Temp 98.4°F | Resp 16 | Ht 63.0 in | Wt 160.0 lb

## 2017-11-27 DIAGNOSIS — F329 Major depressive disorder, single episode, unspecified: Secondary | ICD-10-CM | POA: Insufficient documentation

## 2017-11-27 DIAGNOSIS — K4020 Bilateral inguinal hernia, without obstruction or gangrene, not specified as recurrent: Secondary | ICD-10-CM

## 2017-11-27 DIAGNOSIS — Z09 Encounter for follow-up examination after completed treatment for conditions other than malignant neoplasm: Secondary | ICD-10-CM

## 2017-11-27 DIAGNOSIS — Z79899 Other long term (current) drug therapy: Secondary | ICD-10-CM | POA: Insufficient documentation

## 2017-11-27 DIAGNOSIS — F419 Anxiety disorder, unspecified: Secondary | ICD-10-CM | POA: Insufficient documentation

## 2017-11-27 NOTE — Patient Instructions (Signed)
Hernia A hernia happens when an organ or tissue inside your body pushes out through a weak spot in the belly (abdomen). Follow these instructions at home:  Avoid stretching or overusing (straining) the muscles near the hernia.  Do not lift anything heavier than 10 lb (4.5 kg).  Use the muscles in your leg when you lift something up. Do not use the muscles in your back.  When you cough, try to cough gently.  Eat a diet that has a lot of fiber. Eat lots of fruits and vegetables.  Drink enough fluids to keep your pee (urine) clear or pale yellow. Try to drink 6-8 glasses of water a day.  Take medicines to make your poop soft (stool softeners) as told by your doctor.  Lose weight, if you are overweight.  Do not use any tobacco products, including cigarettes, chewing tobacco, or electronic cigarettes. If you need help quitting, ask your doctor.  Keep all follow-up visits as told by your doctor. This is important. Contact a doctor if:  The skin by the hernia gets puffy (swollen) or red.  The hernia is painful. Get help right away if:  You have a fever.  You have belly pain that is getting worse.  You feel sick to your stomach (nauseous) or you throw up (vomit).  You cannot push the hernia back in place by gently pressing on it while you are lying down.  The hernia: ? Changes in shape or size. ? Is stuck outside your belly. ? Changes color. ? Feels hard or tender. This information is not intended to replace advice given to you by your health care provider. Make sure you discuss any questions you have with your health care provider. Document Released: 07/04/2009 Document Revised: 06/22/2015 Document Reviewed: 11/24/2013 Elsevier Interactive Patient Education  2018 [employer]  

## 2017-12-23 ENCOUNTER — Ambulatory Visit: Payer: Self-pay | Admitting: Family Medicine

## 2018-01-08 ENCOUNTER — Ambulatory Visit: Payer: Self-pay

## 2019-03-02 IMAGING — CT CT ABD-PELV W/ CM
2 of 5 series · 15 of 46 positions shown, 17 images · IV contrast (APPLIED)
Comparison: 05/19/2014

CLINICAL DATA: Right groin pain since [REDACTED]. Lifting heavy
weights at the gym.

EXAM:
CT ABDOMEN AND PELVIS WITH CONTRAST
TECHNIQUE: Multidetector CT imaging of the abdomen and pelvis was performed
using the standard protocol following bolus administration of
intravenous contrast.
CONTRAST:  100mL [id number] IOPAMIDOL ([id number]) INJECTION 61%

[Series 2: axial st · axial · 0.71mm/px · z∈[-364,+60]mm · 12 of 97 slices shown, 14 images]
[im 6/97  soft-tissue]
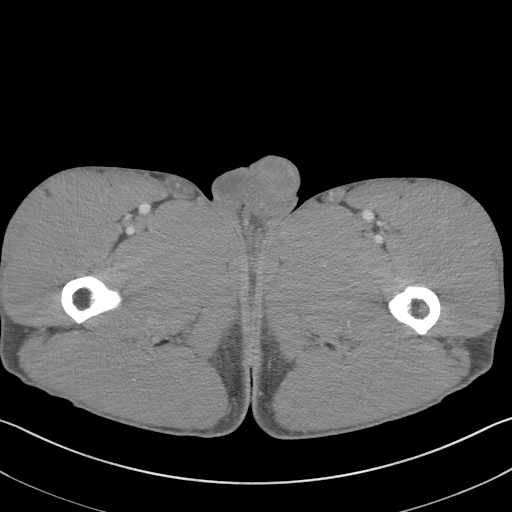
[im 6/97  bone]
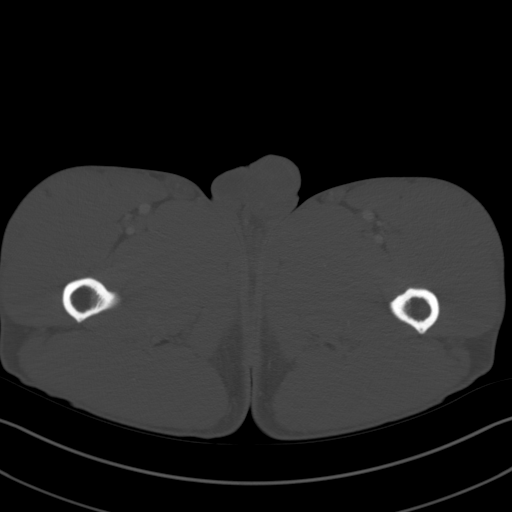
[im 17/97  soft-tissue]
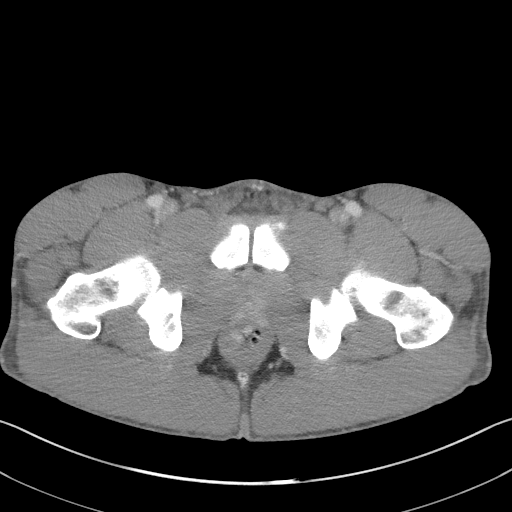
[im 22/97  soft-tissue]
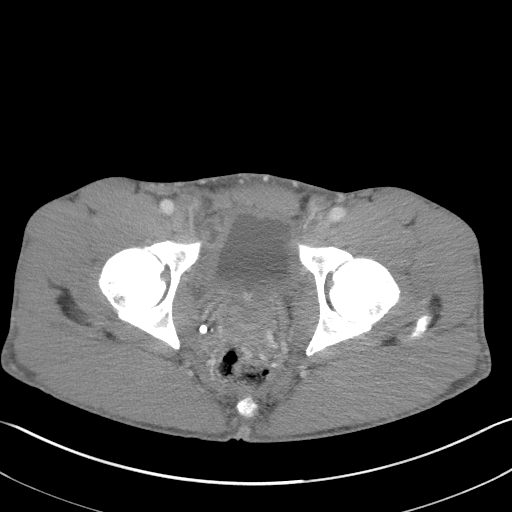
[im 27/97  soft-tissue]
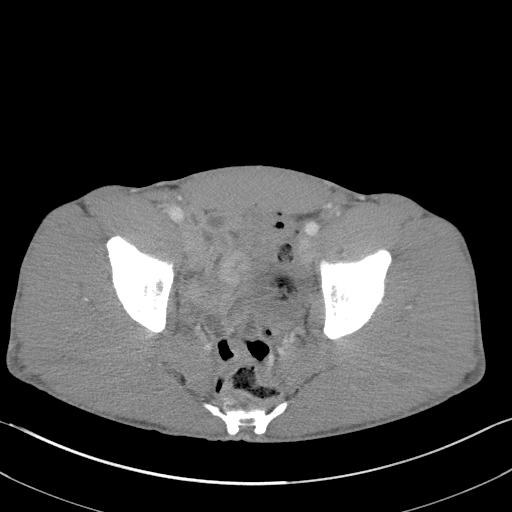
[im 38/97  soft-tissue]
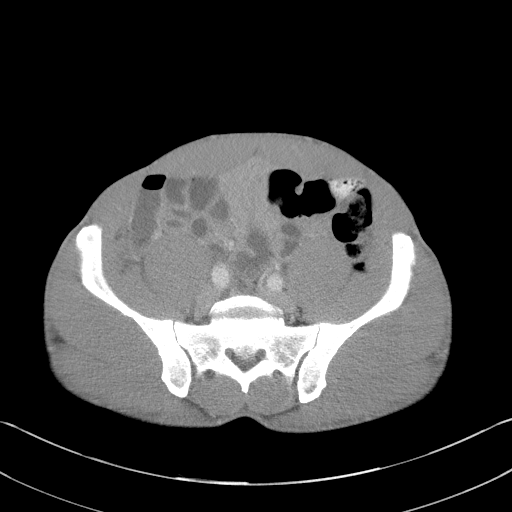
[im 43/97  soft-tissue]
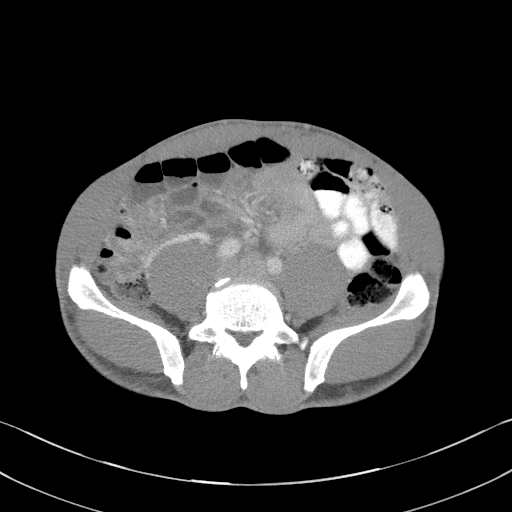
[im 54/97  soft-tissue]
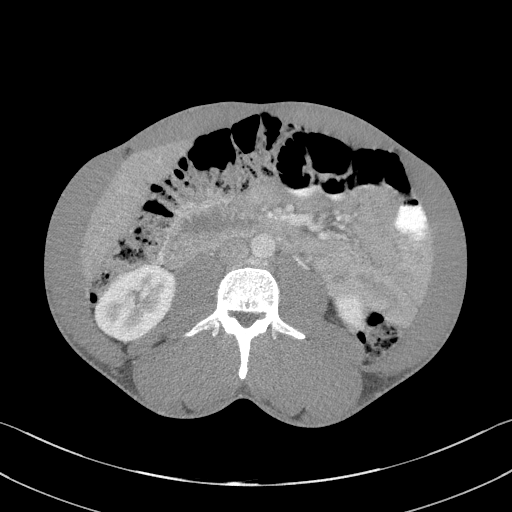
[im 59/97  soft-tissue]
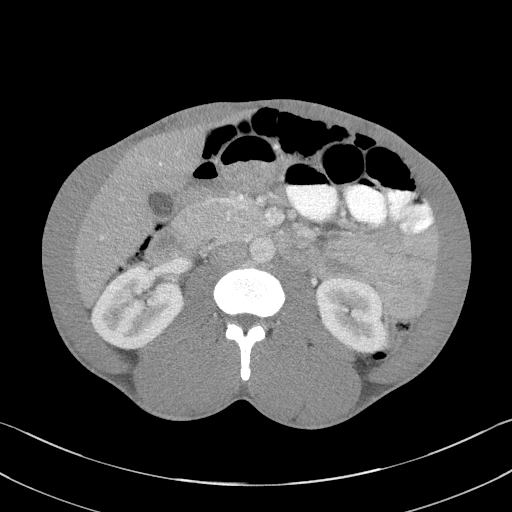
[im 70/97  soft-tissue]
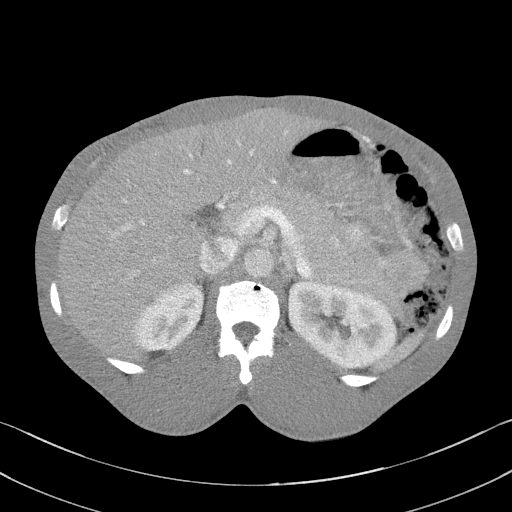
[im 70/97  bone]
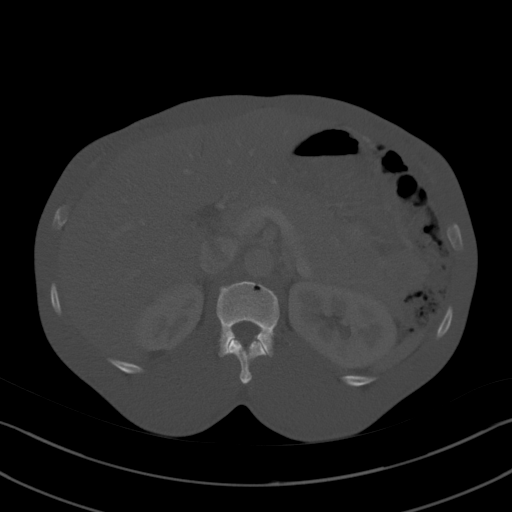
[im 75/97  soft-tissue]
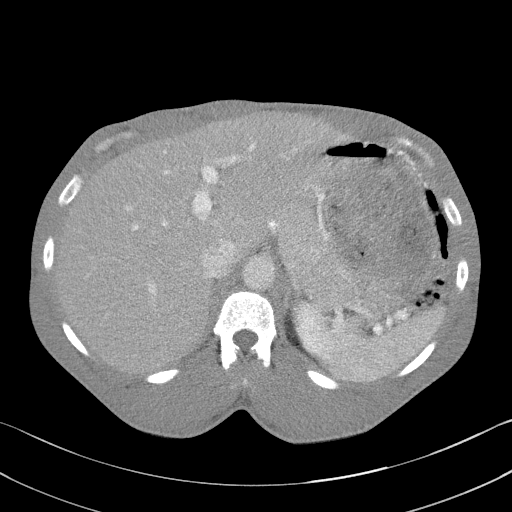
[im 81/97  soft-tissue]
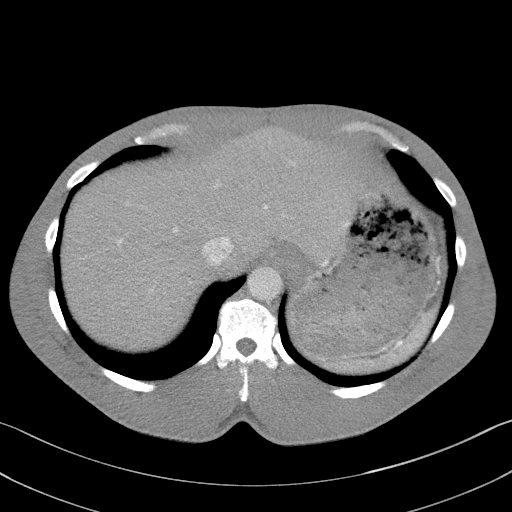
[im 91/97  soft-tissue]
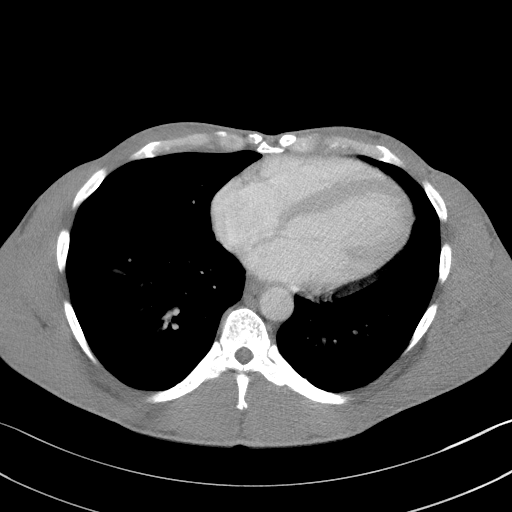

[Series 5: coronal st · coronal · 0.76mm/px · 3 of 101 slices shown]
[im 34/101  soft-tissue]
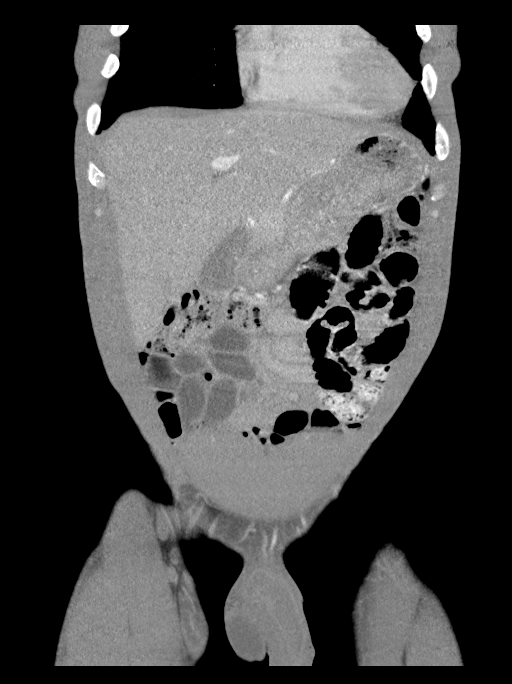
[im 45/101  soft-tissue]
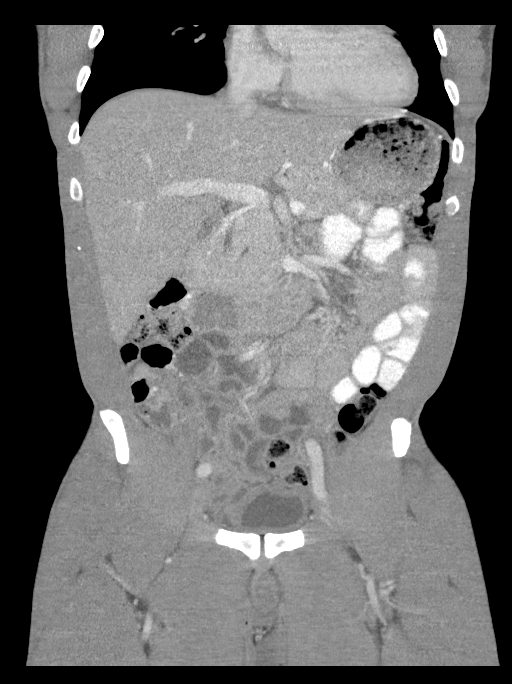
[im 56/101  soft-tissue]
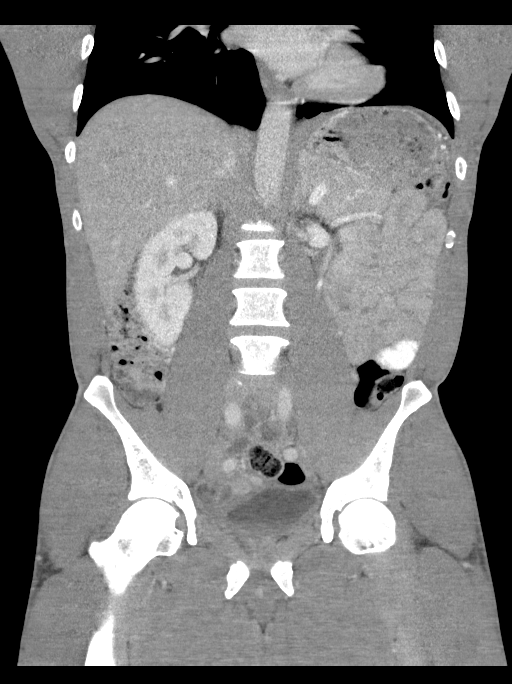

[15 of 46 positions shown; findings below may reference images not displayed]

FINDINGS: Lower chest: Nodule in the left lung base measuring 7.1 mm. This
area was likely not included on the previous study for comparison.

Hepatobiliary: No focal liver abnormality is seen. No gallstones,
gallbladder wall thickening, or biliary dilatation.

Pancreas: Unremarkable. No pancreatic ductal dilatation or
surrounding inflammatory changes.

Spleen: Normal in size without focal abnormality.

Adrenals/Urinary Tract: Adrenal glands are unremarkable. Kidneys are
normal, without renal calculi, focal lesion, or hydronephrosis.
Bladder wall is diffusely thickened, possibly indicating cystitis or
possibly due to under distention.

Stomach/Bowel: Stomach, small bowel, and colon are mostly
decompressed. No wall thickening or infiltrative changes identified.
Scattered stool in the colon. Appendix is not identified.

Vascular/Lymphatic: No significant vascular findings are present. No
enlarged abdominal or pelvic lymph nodes. Nonpathologic lymph nodes
in the groin regions. These could be inflammatory. No change since
previous study.

Reproductive: Prostate gland is not enlarged.

Other: No free air or free fluid in the abdomen. Abdominal wall
musculature appears intact. No evidence of inguinal or femoral
hernias.

Musculoskeletal: No acute or significant osseous findings.
IMPRESSION: 1. Bladder wall thickening may indicate cystitis or possibly due to
under distention.

2.  No evidence of femoral or inguinal hernia.

3. 7.1 mm nodule in the left lung base. Non-contrast chest CT at
6-12 months is recommended. If the nodule is stable at time of
repeat CT, then future CT at 18-24 months (from today's scan) is
considered optional for low-risk patients, but is recommended for
high-risk patients. This recommendation follows the consensus
statement: Guidelines for Management of Incidental Pulmonary Nodules
Detected on CT Images: From the [HOSPITAL] 2841; Radiology
